# Patient Record
Sex: Female | Born: 1965 | Race: Black or African American | Hispanic: No | Marital: Single | Smoking: Never smoker
Health system: Southern US, Community
[De-identification: ages and names within clinical notes are randomized; demographics above are authoritative.]

## PROBLEM LIST (undated history)

## (undated) ENCOUNTER — Emergency Department (HOSPITAL_COMMUNITY): Admission: EM | Payer: Self-pay | Source: Home / Self Care

## (undated) DIAGNOSIS — N920 Excessive and frequent menstruation with regular cycle: Secondary | ICD-10-CM

## (undated) DIAGNOSIS — E039 Hypothyroidism, unspecified: Secondary | ICD-10-CM

## (undated) DIAGNOSIS — D649 Anemia, unspecified: Secondary | ICD-10-CM

## (undated) DIAGNOSIS — D219 Benign neoplasm of connective and other soft tissue, unspecified: Secondary | ICD-10-CM

## (undated) DIAGNOSIS — T148XXA Other injury of unspecified body region, initial encounter: Secondary | ICD-10-CM

## (undated) DIAGNOSIS — J189 Pneumonia, unspecified organism: Secondary | ICD-10-CM

## (undated) HISTORY — PX: WISDOM TOOTH EXTRACTION: SHX21

---

## 2007-07-09 ENCOUNTER — Inpatient Hospital Stay (HOSPITAL_COMMUNITY): Admission: EM | Admit: 2007-07-09 | Discharge: 2007-07-13 | Payer: Self-pay | Admitting: Emergency Medicine

## 2007-07-09 ENCOUNTER — Ambulatory Visit: Payer: Self-pay | Admitting: Cardiology

## 2007-07-10 ENCOUNTER — Encounter (INDEPENDENT_AMBULATORY_CARE_PROVIDER_SITE_OTHER): Payer: Self-pay | Admitting: Internal Medicine

## 2010-06-15 NOTE — H&P (Signed)
NAME:  Valerie Edwards, Valerie Edwards                ACCOUNT NO.:  [id number]   MEDICAL RECORD NO.:  [id number]          PATIENT TYPE:  INP   LOCATION:  0106                         FACILITY:  [facility]   PHYSICIAN:  Marcellus Scott, MD     DATE OF BIRTH:  12/07/1965   DATE OF ADMISSION:  07/09/2007  DATE OF DISCHARGE:                              HISTORY & PHYSICAL   PRIMARY MEDICAL DOCTOR:  Dr. Steva Ready in [place], [place].   CHIEF COMPLAINTS:  Near passing out, lower abdominal pain and diarrhea.   HISTORY OF PRESENTING ILLNESS:  Valerie Edwards is a 45 year old, pleasant,  African-American female patient with history of chronic anemia (unclear  baseline hemoglobin).  She was in her usual state of health until  approximately 9 this morning when she woke up from her bed and while  sitting at the edge of the bed experienced a fairly sudden onset of  lower abdominal sharp 6/10 pain which was nonradiating with associated  nausea but no vomiting.  The patient proceeded to go to the toilet and  sit on the commode.  She had two episodes of diarrhea with soft brown  stools and no blood, melena.  However, while sitting there, she started  having dizziness, lightheadedness and feeling the place go dark.  She  applied some wet towels on the head.  She subsequently came off of the  toilet and felt that she was going to pass out and held onto something  and sat on the floor.  There was no complete loss of consciousness  according to her.  She denies any chest pain, palpitations.  She then  called EMS.  She was apparently minimally responsive when they arrived  and responded to ammonia inhalation and sternal rubs.  She received  normal saline 200 mL prior to arrival with partial improvement of her  symptoms.  Further evaluation in the emergency room revealed patient  with low blood pressure of 88/61 mmHg and markedly anemic with a  hemoglobin of 4.9.  Currently, the patient indicates that her abdominal  pain has  resolved for the last one hour off her medications.  She,  however, complains of soreness in her lower extremities.   PAST MEDICAL HISTORY:  Anemia since 1984.  No history of blood  transfusions.  No other past medical history.   PAST SURGICAL HISTORY:  None.   ALLERGIES:  No known drug allergies.   MEDICATIONS:  Infrequent intake of multivitamins.   FAMILY HISTORY:  The patient's sister is a diabetic.  The patient's  mother has history of asthma.  The patient's maternal grandfather died  at age 14 from heart disease.   SOCIAL HISTORY:  The patient is separated.  She is unemployed currently.  She used to work with [employer].  She has two girl children, 20  and 16 years.  There is no history of tobacco, alcohol or drug abuse.   The patient had menarche at age 40.  Her last menstrual period was 8  days ago.  The patient indicates that for the last three years has been  having intermittent  heavy menstrual bleeding.  However, the most recent  one was the last menstrual period when she had initial three out of five  days of heavy menstrual bleeding, and she had to change eight pads per  day, and she had multiple clots.  She also gives history of feeling weak  progressively since then and some dyspnea on exertion but denies chest  pain, lightheadedness or dizziness until today.  According to her  daughter who is at the bedside, the patient has been also looking paler  and has been having difficulty getting out of bed.  The patient denies  any history of bleeding from the rectum, melena.   REVIEW OF SYSTEMS:  Fourteen systems reviewed and apart from history of  presenting illness is noncontributory.   PHYSICAL EXAMINATION:  Ms. Valerie Edwards is a moderately built and nourished  female patient who is extremely pale in no respiratory distress but  intermittent painful distress in moving lower extremities.  VITAL SIGNS:  98.2 degrees Fahrenheit, blood pressure 100/60, pulse of  86 per  minute regular, respiratory 20 per minute, saturating at 100% on  room air.  HEENT:  Is nontraumatic, normocephalic.  Pupils equally reacting to  light and accommodation.  Oral cavity with enlarged bilateral tonsils  but no erythema.  Mucosa slightly dry but pale and anicteric.  NECK:  Supple.  No JVD or carotid bruit or distended neck veins.  LYMPHATICS:  No lymphadenopathy.  BREAST EXAM:  Deferred.  RESPIRATORY SYSTEM:  Clear to auscultation bilaterally.  CARDIOVASCULAR SYSTEM:  First and second heart sounds are heard fairly  clearly.  No third or fourth heart sounds or murmurs.  ABDOMEN:  Nondistended, nontender.  Soft.  No organomegaly or mass  appreciated.  Bowel sounds are normally heard.  Hernial orifices are  appreciated and normal.  CENTRAL NERVOUS SYSTEM:  The patient is awake, alert, oriented x3.  No  cranial nerve deficits.  EXTREMITIES:  With no cyanosis, clubbing or edema.  Peripheral pulses  are symmetrically felt.  The patient has pain on moving her lower  extremities, but there is no swelling, erythema, rash or tenderness.  SKIN:  Is without any rashes.  MUSCULOSKELETAL SYSTEM:  Unremarkable.   LABORATORY DATA:  CBCs with hemoglobin 4.5, hematocrit 15, MCV 70.2,  white blood cell 5.3, platelets 347.  Electrolytes on ISTAT with sodium  of 141, potassium 3.6, chloride 108, bicarb 22, glucose 86, BUN 6,  creatinine 1.1.  Urinanalysis is not suggestive of urinary tract  infection.  Fecal occult blood testing negative.  Urine pregnancy test  negative.  CT of the abdomen without contrast - impression:  Pericardial  effusion which is moderate to large and minimal pleural fluid noted  bilaterally.  No acute intra-abdominal abnormality.  CT of the pelvis:  Small amount of free fluid in the pelvis with no acute abnormality.  EKG  with normal sinus rhythm at 66 per minute, low voltage.  Normal axis.  Q-  waves in the leads V1 to V3.   ASSESSMENT AND PLAN:  1. Near-syncope  secondary to symptomatic severe anemia, hypotension,      vasovagal from abdominal pain.  2. Severe microcytic symptomatic anemia.  This is probably secondary      to menorrhagia.  Will request anemia panel.  Will transfuse 4 units      of packed red blood cells and monitor post transition hemoglobin.      Will also obtain a peripheral smear.  3. Hypotension ? secondary to dehydration from  diarrhea.  Will hydrate      IV and monitor.  This hypotension seems to be improving.  4. Abdominal pain, diarrhea, lower extremity pain, ? acute      gastroenteritis versus food poisoning.  CT abdomen is negative for      acute abdominal pelvic process.  For pain medications and      monitoring. IV hydration.  5. Menorrhagia.  The patient will need a gynecology consult at some      point to address her menorrhalgia.  6. There is moderate to large pericardial effusion on CT, low voltage      QRS.  However, there is no clinical suggestion of a tamponade.      Cardiology has been consulted.   The patient will be admitted to ICU for close monitoring.      Marcellus Scott, MD  Electronically Signed     AH/MEDQ  D:  07/09/2007  T:  07/09/2007  Job:  [id number]   cc:   Sharyne Peach  Fax: [phone]

## 2010-06-15 NOTE — Discharge Summary (Signed)
NAME:  Valerie Edwards, Valerie Edwards                ACCOUNT NO.:  [id number]   MEDICAL RECORD NO.:  [id number]          PATIENT TYPE:  INP   LOCATION:  1404                         FACILITY:  [facility]   PHYSICIAN:  Hind I Elsaid, MD      DATE OF BIRTH:  23-Nov-1965   DATE OF ADMISSION:  07/09/2007  DATE OF DISCHARGE:  07/13/2007                               DISCHARGE SUMMARY   DISCHARGE DIAGNOSES:  1. Severe iron deficiency anemia.  2. Menorrhagia.  3. Diarrhea with dehydration, resolved.  4. Small pericardial effusion felt to be secondary to hypothyroidism.  5. Bradycardia, asymptomatic, secondary to hypothyroidism.  6. Small left pleural effusion felt to be secondary to uncontrolled      hypothyroidism.  7. Uncontrolled hypothyroidism secondary to noncompliance.  8. 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  9. Hyperlipidemia.   DISCHARGE MEDICATIONS:  1. Fioricet 325 mg p.o. t.i.d.  2. Folic acid 1 mg p.o. daily.  3. Synthroid 150 mcg p.o. q.a.m.  4. Zocor 20 mg p.o. nightly.   CONSULTATION:  1. Cardiology consulted for evaluation of the pericardial effusion      done by Dr. Olga Millers.  2. Gynecology consulted, done by Dr. Huel Cote.  3. Gastroenterology consulted for evaluation of microcytic anemia.   PROCEDURES:  1. CT of abdomen and pelvis:  Pericardial effusion.  No acute intra-      abdominal abnormality.  Small amount of free fluid in the pelvis,      no acute abnormality.  2. MRI of the thoracic spine:  Normal thoracic spine and spinal cord,      aside from mild to moderate posterior epidural lipomatosis in the      upper thoracic region which effaces the thecal sac at T5-T6 without      mass effect on the spinal cord that is possible course of      myelopathic symptoms. Mild C6-C7 disk degeneration, small layering      pleural effusion with prominent atelectasis.  3. Chest x-ray shows  small left pleural effusion, left lower      atelectasis versus airspace disease.   HISTORY OF PRESENT ILLNESS:  Valerie Edwards is a 45 year old pleasant  African American female.  She was in her usual state of health until  approximately June 8, when she woke up from her bed and started to have  sudden onset of lower abdominal sharp pain associated with nausea.  While sitting there, the patient started having dizzy spells with  lightheadedness and feeling the place go dark.  The patient was found to  have marked low blood pressure around 88/61 with hemoglobin of 4.9.  The  patient was admitted to the hospital for evaluation.   HOSPITAL CONSULTS:  PROBLEM #1 - NEAR SYNCOPE FELT TO BE SECONDARY TO  SEVERE ANEMIA:  The patient received 4 units of blood transfusion and  anemia panel did show evidence of severe iron deficiency anemia with  routine of 3.  Smear showed microcytic hypochromic anemia.  Accordingly,  the patient was placed on ferrous sulfate with folic acid supplement.  Gynecology was consulted, where they recommended outpatient workup and  the patient received information for the office and appointment will be  held within 1-2 weeks with Dr. Huel Cote.  The patient had a  stool guaiac negative and Helicobacter pylori negative and no evidence  of GI bleeding was noticed.   PROBLEM #2 - DIARRHEA:  Completely resolved.   PROBLEM #3 - PERICARDIAL EFFUSION:  The patient had a CT of abdomen  which showed evidence of large to moderate pericardial effusion.  Accordingly, the patient was placed on the intensive care unit for  further monitoring and Cardiology consulted, where they recommended 2-D  echo.  The 2-D echo showed a small amount of pericardial effusion.  No  further workup was done.  Cause of pericardial effusion was felt to be  secondary to uncontrolled hypothyroidism.  The patient needs to follow  up with Dr. Olga Millers within 1-2 weeks to repeat another 2-D echo   for evaluation of her pericardial effusion.   PROBLEM #4 - UNCONTROLLED HYPOTHYROIDISM:  The patient was found to have  a TSH of 220.419 with low free T4 and T3.  Accordingly, the patient was  started on Synthroid IV and the patient was advised to comply with her  medications.  The patient was also advised to follow her thyroid  function within 4-6 weeks with her primary care physician for further  adjustment of her Synthroid dose.   PROBLEM #5 - SMALL PLEURAL EFFUSION: This was felt possibly secondary to  uncontrolled hypothyroidism.   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  The patient at this time is asymptomatic.  We advised the  patient to follow up with her primary care physician regarding her CT  scan report for further management if she develops symptoms.   PROBLEM #7 - SINUS BRADYCARDIA: This was felt secondary to severe  hypothyroidism   PROBLEM #8 - HYPERLIPIDEMIA:  The patient was placed on Zocor and  advised to follow her LFTs with her primary care physician.   DISPOSITION AND FOLLOWUP:  1. We felt that the patient was medically stable to be discharged home      and to follow up with Dr. Huel Cote from [facility]      Gynecology for further workup of her menorrhagia.  2. Follow with your primary care physician your thyroid function tests      and your back x-ray.  3. Follow up with Dr. Olga Millers, a 2-D echo for reassessment of      the pericardial effusion.      Hind Bosie Helper, MD  Electronically Signed     HIE/MEDQ  D:  07/13/2007  T:  07/13/2007  Job:  [id number]   cc:   Sharyne Peach  Fax: [phone]

## 2010-06-15 NOTE — Consult Note (Signed)
NAME:  Valerie Edwards, Valerie Edwards                ACCOUNT NO.:  [id number]   MEDICAL RECORD NO.:  [id number]          PATIENT TYPE:  INP   LOCATION:  1223                         FACILITY[facility]   PHYSICIAN:  Jordan Hawks. Elnoria Howard, MD    DATE OF BIRTH:  March 01, 1965   DATE OF CONSULTATION:  07/10/2007  DATE OF DISCHARGE:                                 CONSULTATION   REASON FOR CONSULTATION:  Anemia and abdominal pain.   REFERRING PHYSICIAN:  [facility] hospitalist, this is an unassigned  patient.   HISTORY OF PRESENT ILLNESS:  This is a 45 year old female with a past  medical history of chronic anemia who is admitted to the hospital with  complaints of weakness and lower abdominal pain.  Apparently when she  woke up in the morning, she used the restroom.  At that time, she  experienced some lower abdominal pain which was crampy in nature and  subsequently she went to the bathroom.  At that time she did have some  episodes of diarrhea, but there are no reports of hematochezia or  melena.  While she was using the commode,  lightheadedness and dizziness  ensued.  She subsequently presented to the emergency room for further  evaluation and treatment.  While in the emergency room, she was noted to  have a low systolic blood pressure in the 80s range and she was  aggressively treated with IV hydration.  Because of her complaints of  abdominal pain and these near syncopal type of episodes, a CT scan was  performed of the abdomen.  There is finding of a moderate-sized  pericardial effusion, but no acute intra-abdominal findings.  Upon  interview at this time, the patient denies having any further abdominal  pain.  She only states that her pain was during the time that she used  the restroom on the day of admission.  She again denied having any  hematochezia or melena.  No recent use of NSAIDs.  No prior history of  GI issues in the past.  She does report having menorrhagia of late.  She  had her last period  approximately 1 week ago and subsequently after  having the period she felt markedly weaker.  Her prior periods per her  report have not been heavy.  She does not have a history of menorrhagia  the past.  Per her report, she does have fibroids.   PAST MEDICAL/SURGICAL HISTORY:  As stated above.   FAMILY HISTORY:  Noncontributory.   ALLERGIES:  NO KNOWN DRUG ALLERGIES.   SOCIAL HISTORY:  No alcohol, tobacco or illicit drug use.   REVIEW OF SYSTEMS:  Negative as stated above in the history of present  illness, otherwise negative.   ALLERGIES:  NO KNOWN DRUG ALLERGIES.   MEDICATIONS:  1. Iron 325 mg 1 p.o. t.i.d.  2. Protonix 40 mg IV daily.  3. Tylenol 650 mg 1 p.o. q.4 h. p.r.n.  4. Dilaudid 0.5 mg 1 mg IV q.4 h. p.r.n.  5. Zofran 4 mg IV q.8 h. p.r.n.   PHYSICAL EXAMINATION:  VITAL SIGNS:  Stable.  GENERAL:  The patient is in no acute distress, alert and oriented.  HEENT:  Normocephalic, atraumatic.  Extraocular muscles intact.  NECK:  Supple.  No lymphadenopathy.  LUNGS:  Clear to auscultation bilaterally.  CARDIOVASCULAR:  Regular rate and rhythm.  ABDOMEN:  Obese, soft, nontender, nondistended.  Positive bowel sounds.  EXTREMITIES:  No clubbing, cyanosis or edema.   LABORATORY DATA:  White blood cell count is 5.3, hemoglobin 10.1 which  is an increase from 5.6 after 4 units of packed red blood cells, MCV  77.9, platelets at 260, PT is 14.8, INR 1.1, sodium 139, potassium 3.9,  chloride 109, CO2 21, glucose 74, BUN 3, creatinine 0.8, calcium 7.8,  iron is 11, TIBC is 401, percent saturation is 3, ferritin is 3,  hemoccult negative.   IMPRESSION:  1. Severe iron-deficiency anemia.  2. Menorrhagia.  3. Diarrhea/abdominal pain which is resolved.  4. Pericardial effusion.   After evaluation of the patient, she denies having any GI symptoms at  this time, although these had been the precipitating symptoms for her  admission.  There is no report of any hematochezia,  melena or current  abdominal pain.  Nursing does not report any further diarrhea and no  abdominal complaints.  She does give a history of menorrhagia.  I am  suspecting that this is the source of her severe iron-deficiency anemia.  Typically in  females who are currently menstruating, GI consultations  are typically deferred until the vaginal bleeding issues can be  resolved.  I do not get a sense that she has any overt GI source of  bleeding at this time, although if her clinical situation changes, an  EGD and colonoscopy can be performed.   PLAN:  I would recommend a gynecology consult first.  Certainly if they  recommend that GI evaluation should be pursued, this can be performed  without hesitation.  Otherwise, no further GI workup at this time.      Jordan Hawks Elnoria Howard, MD  Electronically Signed     PDH/MEDQ  D:  07/10/2007  T:  07/10/2007  Job:  [id number]

## 2010-06-15 NOTE — Consult Note (Signed)
NAME:  Babin, Doaa                ACCOUNT NO.:  [id number]   MEDICAL RECORD NO.:  [id number]          PATIENT TYPE:  INP   LOCATION:  0106                         FACILITY:  [facility]   PHYSICIAN:  Madolyn Frieze. Jens Som, MD, FACCDATE OF BIRTH:  24-Dec-1965   DATE OF CONSULTATION:  07/09/2007  DATE OF DISCHARGE:                                 CONSULTATION   Ms. Brutus is a 42-year female with a past medical history of  hypothyroidism who I am asked to evaluate for pericardial effusion.  Note the patient has no prior cardiac history.  Over the past few weeks  she has noticed some dyspnea on exertion.  However, she denies any  orthopnea, PND, pedal edema, palpitations, presyncope, syncope,  exertional chest pain.  She has had significant fatigue.  This morning,  she felt weak and had some lower abdominal pain.  The pain was described  as sharp.  It did not radiate.  There was no associated melena,  hematochezia, hematemesis or dysuria.  There were no acholic stools.  She is having bowel movements.  She also had some nausea.  When she  stood up she felt like I was going to pass out and sat down on the  commode.  Because of the above, she was brought to the emergency room.  In the emergency room a CT of the abdomen showed a moderate to large  pericardial effusion and cardiology was asked to further evaluate.  Note  the patient has had some cold intolerance.  She also has had fatigue but  there is no recent weight loss.  She is on no medications at home.  She  has no known drug allergies.   PAST MEDICAL HISTORY:  Significant for hypothyroidism by her report.  However, she states she is not taking any replacement therapy.  She also  has a history of fibroids.  There is no diabetes mellitus, hypertension  or hyperlipidemia.   FAMILY HISTORY:  Negative for coronary disease.   SOCIAL HISTORY:  She denies any tobacco, alcohol or drug use.   REVIEW OF SYSTEMS:  She denies any headaches or fevers or  chills.  There  is no productive cough or hemoptysis.  There is no dysphagia,  odynophagia, melena or hematochezia.  There is no dysuria or hematuria.  There is no rashes or seizure activity.  There is no orthopnea, PND or  pedal edema.  She has had fatigue as described in HPI.  Remaining  systems are negative.   PHYSICAL EXAMINATION TODAY:  Shows that her pulse is 66.  Her blood  pressure on arrival was 88/61.  Temperature is 96.6.  She is well-  developed and well-nourished in no acute distress at present.  Skin is  warm and dry although pale.  She does not appear to be depressed and  there is no peripheral clubbing.  BACK:  Normal.  HEENT:  Normal with normal eyelids.  Note there are pale conjunctivae  consistent with an anemia.  NECK:  Supple with a normal upstroke bilaterally.  There are no bruits  noted.  There is no  jugular distention and there is no thyromegaly  noted.  CHEST:  Clear to auscultation with normal expansion.  CARDIOVASCULAR:  Reveals a regular rate and rhythm.  Normal S1 and S2.  There are no murmurs, rubs or gallops noted.  ABDOMINAL EXAM:  Not tender or distended.  Positive bowel sounds.  No  hepatosplenomegaly and no mass appreciated.  There is no abdominal  bruit.  She has 2+ femoral pulses bilaterally and no bruits.  EXTREMITIES:  Show no edema and I can palpate no cords.  She has 2+  dorsalis pedis pulses bilaterally.  NEUROLOGIC EXAM:  Grossly intact.   A CT of her abdomen showed that the upper cuts through the chest showed  a moderate to large pericardial effusion by report.  There was no  ascites noted.  There was a small amount of free fluid in the pelvis but  otherwise is unremarkable.  Her pregnancy test was negative.  White  blood cell count of 5.3 with a hemoglobin of 4.5 and hematocrit of 15.  Her platelet count is 347.  Sodium is 141 with a potassium of 3.6.  BUN  and creatinine are 6 and 1.1 respectively.  Her electrocardiogram shows  a sinus  rhythm at a rate of 66.  There is poor R-wave progression and  prior septal infarct cannot be excluded.   DIAGNOSES:  1. Pericardial effusion - the etiology of this is unclear.  I did      review the CT scan and it appears to be moderate by my review.  We      will check an echocardiogram tomorrow morning to better quantitate.      Note, there is no jugular venous distention on examination and I do      not think it is hemodynamically significant at this time.  I could      not appreciate a pulsus paradoxus.  I agree with checking a TSH.  I      wonder whether her history of hypothyroidism and noncompliance with      taking a replacement therapy may be contributing to her pericardial      effusion.  2. Severe anemia - the etiology of this is unclear but certainly may      be related to her menstrual cycles and also note she does have      fibroids.  I agree with transfusion and I think this is most likely      the cause of her presyncope.  I will leave the anemia evaluation      per primary service.  3. Hypothyroidism - as above we will check a TSH and treat as      indicated.  4. Presyncope - as per above we feel this is most related to her      anemia.   We will be happy to follow while she is in the hospital.      Madolyn Frieze. Jens Som, MD, [facility]  Electronically Signed     BSC/MEDQ  D:  07/09/2007  T:  07/09/2007  Job:  [id number]

## 2010-10-28 LAB — COMPREHENSIVE METABOLIC PANEL
ALT: 13
ALT: 18
AST: 33
AST: 41 — ABNORMAL HIGH
Albumin: 2.7 — ABNORMAL LOW
Albumin: 3.5
Alkaline Phosphatase: 23 — ABNORMAL LOW
Calcium: 7.8 — ABNORMAL LOW
Calcium: 8.2 — ABNORMAL LOW
Creatinine, Ser: 0.82
GFR calc Af Amer: >60
GFR calc Af Amer: >60
Potassium: 4.3
Sodium: 137
Sodium: 140
Total Protein: 7.1

## 2010-10-28 LAB — CBC
HCT: 15 — ABNORMAL LOW
HCT: 31.3 — ABNORMAL LOW
HCT: 32.6 — ABNORMAL LOW
Hemoglobin: 10.3 — ABNORMAL LOW
Hemoglobin: 10.7 — ABNORMAL LOW
MCHC: 32.5
MCHC: 32.9
MCHC: 33
MCV: 77.9 — ABNORMAL LOW
MCV: 78.5
MCV: 78.6
Platelets: 236
Platelets: 242
Platelets: 347
RBC: 3.94
RBC: 3.95
RBC: 3.99
RBC: 4.15
RDW: 19.7 — ABNORMAL HIGH
RDW: 21.6 — ABNORMAL HIGH
WBC: 5
WBC: 5.2
WBC: 5.3

## 2010-10-28 LAB — TSH: TSH: 220.419 — ABNORMAL HIGH

## 2010-10-28 LAB — URINALYSIS, ROUTINE W REFLEX MICROSCOPIC
Hgb urine dipstick: NEGATIVE
Leukocytes, UA: NEGATIVE
Protein, ur: NEGATIVE
Specific Gravity, Urine: 1.012
Urobilinogen, UA: 0.2

## 2010-10-28 LAB — IRON AND TIBC
Iron: 11 — ABNORMAL LOW
TIBC: 401

## 2010-10-28 LAB — T4, FREE: Free T4: 0.27 — ABNORMAL LOW

## 2010-10-28 LAB — ABO/RH: ABO/RH(D): A POS

## 2010-10-28 LAB — LIPID PANEL
HDL: 46
Triglycerides: 98
VLDL: 20

## 2010-10-28 LAB — POCT I-STAT, CHEM 8
BUN: 6
Calcium, Ion: 1.14
Creatinine, Ser: 1.1
HCT: 17 — ABNORMAL LOW
TCO2: 22

## 2010-10-28 LAB — CROSSMATCH
ABO/RH(D): A POS
Antibody Screen: NEGATIVE

## 2010-10-28 LAB — BASIC METABOLIC PANEL
CO2: 21
Calcium: 7.8 — ABNORMAL LOW
Chloride: 109
Chloride: 109
Creatinine, Ser: 0.8
GFR calc Af Amer: >60
GFR calc non Af Amer: >60
Glucose, Bld: 74
Potassium: 3.5
Sodium: 134 — ABNORMAL LOW

## 2010-10-28 LAB — HEMOGLOBIN AND HEMATOCRIT, BLOOD: Hemoglobin: 10 — ABNORMAL LOW

## 2010-10-28 LAB — URINE MICROSCOPIC-ADD ON

## 2010-10-28 LAB — PATHOLOGIST SMEAR REVIEW

## 2010-10-28 LAB — DIFFERENTIAL
Blasts: 0
Eosinophils Relative: 1
Metamyelocytes Relative: 0
Monocytes Relative: 3
Neutrophils Relative %: 82 — ABNORMAL HIGH
nRBC: 0

## 2010-10-28 LAB — OCCULT BLOOD X 1 CARD TO LAB, STOOL: Fecal Occult Bld: NEGATIVE

## 2010-10-28 LAB — H. PYLORI ANTIBODY, IGG: H Pylori IgG: 0.4

## 2010-10-28 LAB — FERRITIN: Ferritin: 3 — ABNORMAL LOW (ref 10–291)

## 2010-10-28 LAB — T3, FREE: T3, Free: <0.3 — ABNORMAL LOW (ref 2.3–4.2)

## 2010-10-28 LAB — RETICULOCYTES: Retic Count, Absolute: 27.7

## 2010-10-28 LAB — SAMPLE TO BLOOD BANK

## 2010-10-28 LAB — PROTIME-INR: INR: 1.1

## 2010-10-28 LAB — VITAMIN B12: Vitamin B-12: 1225 — ABNORMAL HIGH (ref 211–911)

## 2010-10-28 LAB — PREPARE RBC (CROSSMATCH)

## 2010-10-28 LAB — SAVE SMEAR

## 2011-04-06 ENCOUNTER — Other Ambulatory Visit: Payer: Self-pay | Admitting: Internal Medicine

## 2011-04-06 DIAGNOSIS — Z1231 Encounter for screening mammogram for malignant neoplasm of breast: Secondary | ICD-10-CM

## 2011-04-21 ENCOUNTER — Ambulatory Visit: Payer: Self-pay

## 2011-05-10 ENCOUNTER — Ambulatory Visit
Admission: RE | Admit: 2011-05-10 | Discharge: 2011-05-10 | Disposition: A | Discharge status: Home / Self Care | Payer: 59 | Source: Ambulatory Visit | Attending: Internal Medicine | Admitting: Internal Medicine

## 2011-05-10 DIAGNOSIS — Z1231 Encounter for screening mammogram for malignant neoplasm of breast: Secondary | ICD-10-CM

## 2013-01-08 ENCOUNTER — Other Ambulatory Visit: Payer: Self-pay | Admitting: Obstetrics & Gynecology

## 2013-01-08 DIAGNOSIS — D259 Leiomyoma of uterus, unspecified: Secondary | ICD-10-CM

## 2013-01-14 ENCOUNTER — Telehealth: Payer: Self-pay | Admitting: Radiology

## 2013-01-14 NOTE — Telephone Encounter (Signed)
Reminder call for app't on 01/15/2013 at 3:30 pm/arrive at 3:15 pm.  Left message on patient's M#.  Amado Nash, RN 01/14/2013 3:19 PM

## 2013-01-15 ENCOUNTER — Ambulatory Visit
Admission: RE | Admit: 2013-01-15 | Discharge: 2013-01-15 | Disposition: A | Discharge status: Home / Self Care | Payer: 59 | Source: Ambulatory Visit | Attending: Obstetrics & Gynecology | Admitting: Obstetrics & Gynecology

## 2013-01-15 ENCOUNTER — Other Ambulatory Visit: Payer: Self-pay | Admitting: Obstetrics & Gynecology

## 2013-01-15 DIAGNOSIS — D259 Leiomyoma of uterus, unspecified: Secondary | ICD-10-CM

## 2013-01-26 ENCOUNTER — Ambulatory Visit
Admission: RE | Admit: 2013-01-26 | Discharge: 2013-01-26 | Disposition: A | Discharge status: Home / Self Care | Payer: 59 | Source: Ambulatory Visit | Attending: Obstetrics & Gynecology | Admitting: Obstetrics & Gynecology

## 2013-01-26 DIAGNOSIS — D259 Leiomyoma of uterus, unspecified: Secondary | ICD-10-CM

## 2013-01-26 MED ORDER — GADOBENATE DIMEGLUMINE 529 MG/ML IV SOLN
12.0000 mL | Freq: Once | INTRAVENOUS | Status: AC | PRN
  Administered 2013-01-26: 12 mL via INTRAVENOUS

## 2013-02-04 ENCOUNTER — Telehealth: Payer: Self-pay | Admitting: Emergency Medicine

## 2013-02-04 NOTE — Telephone Encounter (Signed)
PT CALLED TO CK ON MRI RESULTS- TOLD HER THAT DR SHICK REVIEWED IT THIS MORNING AND SHE IS GOOD TO GO FOR [place]. WILL CK WITH INS. FOR [facility] AND [facility] WILL CONTACT HER WITH APPT.

## 2013-02-08 ENCOUNTER — Other Ambulatory Visit: Payer: Self-pay | Admitting: Diagnostic Neuroimaging

## 2013-02-08 DIAGNOSIS — G35 Multiple sclerosis: Secondary | ICD-10-CM

## 2013-02-14 ENCOUNTER — Other Ambulatory Visit: Payer: Self-pay | Admitting: Radiology

## 2013-02-14 ENCOUNTER — Other Ambulatory Visit: Payer: Self-pay | Admitting: Interventional Radiology

## 2013-02-14 DIAGNOSIS — D219 Benign neoplasm of connective and other soft tissue, unspecified: Secondary | ICD-10-CM

## 2013-02-18 ENCOUNTER — Ambulatory Visit (HOSPITAL_COMMUNITY): Admission: RE | Admit: 2013-02-18 | Payer: 59 | Source: Ambulatory Visit

## 2013-02-20 ENCOUNTER — Other Ambulatory Visit: Payer: Self-pay | Admitting: Radiology

## 2013-02-20 ENCOUNTER — Encounter (HOSPITAL_COMMUNITY): Payer: Self-pay | Admitting: Pharmacy Technician

## 2013-02-21 ENCOUNTER — Other Ambulatory Visit (HOSPITAL_COMMUNITY): Payer: Self-pay | Admitting: Radiology

## 2013-02-26 ENCOUNTER — Observation Stay (HOSPITAL_COMMUNITY)
Admission: RE | Admit: 2013-02-26 | Discharge: 2013-02-27 | Disposition: A | Discharge status: Home / Self Care | Payer: 59 | Source: Ambulatory Visit | Attending: Interventional Radiology | Admitting: Interventional Radiology

## 2013-02-26 ENCOUNTER — Ambulatory Visit (HOSPITAL_COMMUNITY)
Admission: RE | Admit: 2013-02-26 | Discharge: 2013-02-26 | Disposition: A | Discharge status: Home / Self Care | Payer: 59 | Source: Ambulatory Visit | Attending: Interventional Radiology | Admitting: Interventional Radiology

## 2013-02-26 ENCOUNTER — Encounter (HOSPITAL_COMMUNITY): Payer: Self-pay

## 2013-02-26 ENCOUNTER — Other Ambulatory Visit: Payer: Self-pay | Admitting: Interventional Radiology

## 2013-02-26 VITALS — BP 106/57 | HR 64 | Temp 98.1°F | Resp 16 | Ht 64.0 in | Wt 149.0 lb

## 2013-02-26 DIAGNOSIS — Z79899 Other long term (current) drug therapy: Secondary | ICD-10-CM | POA: Insufficient documentation

## 2013-02-26 DIAGNOSIS — D219 Benign neoplasm of connective and other soft tissue, unspecified: Secondary | ICD-10-CM

## 2013-02-26 DIAGNOSIS — D649 Anemia, unspecified: Secondary | ICD-10-CM | POA: Insufficient documentation

## 2013-02-26 DIAGNOSIS — D259 Leiomyoma of uterus, unspecified: Principal | ICD-10-CM | POA: Insufficient documentation

## 2013-02-26 DIAGNOSIS — N920 Excessive and frequent menstruation with regular cycle: Secondary | ICD-10-CM

## 2013-02-26 HISTORY — DX: Anemia, unspecified: D64.9

## 2013-02-26 HISTORY — PX: UTERINE ARTERY EMBOLIZATION: SHX2629

## 2013-02-26 HISTORY — DX: Benign neoplasm of connective and other soft tissue, unspecified: D21.9

## 2013-02-26 HISTORY — DX: Excessive and frequent menstruation with regular cycle: N92.0

## 2013-02-26 LAB — CBC
HEMATOCRIT: 28 % — AB (ref 36.0–46.0)
Hemoglobin: 8.7 g/dL — ABNORMAL LOW (ref 12.0–15.0)
MCH: 24.7 pg — AB (ref 26.0–34.0)
MCHC: 31.1 g/dL (ref 30.0–36.0)
MCV: 79.5 fL (ref 78.0–100.0)
PLATELETS: 407 10*3/uL — AB (ref 150–400)
RBC: 3.52 MIL/uL — ABNORMAL LOW (ref 3.87–5.11)
RDW: 17.3 % — AB (ref 11.5–15.5)
WBC: 6.7 10*3/uL (ref 4.0–10.5)

## 2013-02-26 LAB — BASIC METABOLIC PANEL
BUN: 9 mg/dL (ref 6–23)
CALCIUM: 8.9 mg/dL (ref 8.4–10.5)
CHLORIDE: 105 meq/L (ref 96–112)
CO2: 23 meq/L (ref 19–32)
Creatinine, Ser: 0.58 mg/dL (ref 0.50–1.10)
GFR calc Af Amer: >90 mL/min (ref >90)
GFR calc non Af Amer: >90 mL/min (ref >90)
GLUCOSE: 97 mg/dL (ref 70–99)
Potassium: 3.6 mEq/L — ABNORMAL LOW (ref 3.7–5.3)
Sodium: 139 mEq/L (ref 137–147)

## 2013-02-26 LAB — APTT: aPTT: 26 seconds (ref 24–37)

## 2013-02-26 LAB — PROTIME-INR
INR: 0.96 (ref 0.00–1.49)
Prothrombin Time: 12.6 seconds (ref 11.6–15.2)

## 2013-02-26 LAB — HCG, SERUM, QUALITATIVE: Preg, Serum: NEGATIVE

## 2013-02-26 MED ORDER — PROMETHAZINE HCL 25 MG PO TABS: 25.0000 mg | ORAL_TABLET | Freq: Three times a day (TID) | ORAL | Status: DC | PRN

## 2013-02-26 MED ORDER — SODIUM CHLORIDE 0.9 % IV SOLN: 250.0000 mL | INTRAVENOUS | Status: DC | PRN

## 2013-02-26 MED ORDER — MIDAZOLAM HCL 2 MG/2ML IJ SOLN
INTRAMUSCULAR | Status: AC
  Filled 2013-02-26: qty 4

## 2013-02-26 MED ORDER — ONDANSETRON HCL 4 MG/2ML IJ SOLN: 4.0000 mg | Freq: Four times a day (QID) | INTRAMUSCULAR | Status: DC | PRN

## 2013-02-26 MED ORDER — KETOROLAC TROMETHAMINE 30 MG/ML IJ SOLN
30.0000 mg | INTRAMUSCULAR | Status: AC
  Administered 2013-02-26: 30 mg via INTRAVENOUS
  Filled 2013-02-26: qty 1

## 2013-02-26 MED ORDER — DEXAMETHASONE SODIUM PHOSPHATE 10 MG/ML IJ SOLN
10.0000 mg | Freq: Once | INTRAMUSCULAR | Status: AC
  Administered 2013-02-26: 10 mg via INTRAVENOUS
  Filled 2013-02-26: qty 1

## 2013-02-26 MED ORDER — PROMETHAZINE HCL 25 MG RE SUPP
25.0000 mg | Freq: Three times a day (TID) | RECTAL | Status: DC | PRN
  Filled 2013-02-26: qty 1

## 2013-02-26 MED ORDER — SODIUM CHLORIDE 0.9 % IV SOLN
Freq: Once | INTRAVENOUS | Status: AC
  Administered 2013-02-26: 08:00:00 via INTRAVENOUS

## 2013-02-26 MED ORDER — MIDAZOLAM HCL 2 MG/2ML IJ SOLN
INTRAMUSCULAR | Status: AC | PRN
  Administered 2013-02-26: 2 mg via INTRAVENOUS

## 2013-02-26 MED ORDER — FENTANYL CITRATE 0.05 MG/ML IJ SOLN
INTRAMUSCULAR | Status: AC
  Filled 2013-02-26: qty 8

## 2013-02-26 MED ORDER — FERROUS SULFATE 325 (65 FE) MG PO TABS
325.0000 mg | ORAL_TABLET | Freq: Two times a day (BID) | ORAL | Status: DC
  Administered 2013-02-26 – 2013-02-27 (×2): 325 mg via ORAL
  Filled 2013-02-26 (×4): qty 1

## 2013-02-26 MED ORDER — DOCUSATE SODIUM 100 MG PO CAPS
100.0000 mg | ORAL_CAPSULE | Freq: Two times a day (BID) | ORAL | Status: DC
  Administered 2013-02-26: 100 mg via ORAL
  Filled 2013-02-26 (×3): qty 1

## 2013-02-26 MED ORDER — LEVOTHYROXINE SODIUM 175 MCG PO TABS
175.0000 ug | ORAL_TABLET | Freq: Every day | ORAL | Status: DC
  Administered 2013-02-27: 175 ug via ORAL
  Filled 2013-02-26 (×2): qty 1

## 2013-02-26 MED ORDER — IOHEXOL 300 MG/ML  SOLN
50.0000 mL | Freq: Once | INTRAMUSCULAR | Status: AC | PRN
  Administered 2013-02-26: 1 mL via INTRA_ARTERIAL

## 2013-02-26 MED ORDER — SODIUM CHLORIDE 0.9 % IJ SOLN: 3.0000 mL | Freq: Two times a day (BID) | INTRAMUSCULAR | Status: DC

## 2013-02-26 MED ORDER — SODIUM CHLORIDE 0.9 % IJ SOLN: 9.0000 mL | INTRAMUSCULAR | Status: DC | PRN

## 2013-02-26 MED ORDER — SODIUM CHLORIDE 0.9 % IJ SOLN: 3.0000 mL | INTRAMUSCULAR | Status: DC | PRN

## 2013-02-26 MED ORDER — DIPHENHYDRAMINE HCL 12.5 MG/5ML PO ELIX
12.5000 mg | ORAL_SOLUTION | Freq: Four times a day (QID) | ORAL | Status: DC | PRN
  Filled 2013-02-26: qty 5

## 2013-02-26 MED ORDER — HYDROMORPHONE 0.3 MG/ML IV SOLN
INTRAVENOUS | Status: DC
  Administered 2013-02-26: 12:00:00 via INTRAVENOUS
  Administered 2013-02-26: 1.2 mg via INTRAVENOUS
  Administered 2013-02-27: 0.3 mg via INTRAVENOUS
  Administered 2013-02-27: 0.9 mg via INTRAVENOUS

## 2013-02-26 MED ORDER — SODIUM CHLORIDE 0.9 % IV SOLN
INTRAVENOUS | Status: DC
Start: 2013-02-26 — End: 2013-02-27
  Administered 2013-02-26: 15:00:00 via INTRAVENOUS

## 2013-02-26 MED ORDER — FENTANYL CITRATE 0.05 MG/ML IJ SOLN
INTRAMUSCULAR | Status: AC | PRN
  Administered 2013-02-26: 100 ug via INTRAVENOUS
  Administered 2013-02-26 (×2): 50 ug via INTRAVENOUS

## 2013-02-26 MED ORDER — ONDANSETRON HCL 4 MG/2ML IJ SOLN
4.0000 mg | Freq: Four times a day (QID) | INTRAMUSCULAR | Status: DC | PRN
  Administered 2013-02-26: 4 mg via INTRAVENOUS
  Filled 2013-02-26: qty 2

## 2013-02-26 MED ORDER — NALOXONE HCL 0.4 MG/ML IJ SOLN: 0.4000 mg | INTRAMUSCULAR | Status: DC | PRN

## 2013-02-26 MED ORDER — CEFAZOLIN SODIUM-DEXTROSE 2-3 GM-% IV SOLR
2.0000 g | Freq: Once | INTRAVENOUS | Status: AC
  Administered 2013-02-26: 2 g via INTRAVENOUS
  Filled 2013-02-26: qty 50

## 2013-02-26 MED ORDER — HYDROMORPHONE 0.3 MG/ML IV SOLN
INTRAVENOUS | Status: AC
  Administered 2013-02-26: 1.2 mg via INTRAVENOUS
  Filled 2013-02-26: qty 25

## 2013-02-26 MED ORDER — DIPHENHYDRAMINE HCL 50 MG/ML IJ SOLN
12.5000 mg | Freq: Four times a day (QID) | INTRAMUSCULAR | Status: DC | PRN
  Administered 2013-02-26: 12.5 mg via INTRAVENOUS
  Filled 2013-02-26: qty 1

## 2013-02-26 MED ORDER — HYDROMORPHONE HCL PF 2 MG/ML IJ SOLN
INTRAMUSCULAR | Status: AC
  Filled 2013-02-26: qty 1

## 2013-02-26 MED ORDER — SODIUM CHLORIDE 0.9 % IV SOLN: INTRAVENOUS | Status: DC

## 2013-02-26 MED ORDER — KETOROLAC TROMETHAMINE 30 MG/ML IJ SOLN
30.0000 mg | Freq: Four times a day (QID) | INTRAMUSCULAR | Status: DC
  Administered 2013-02-26 – 2013-02-27 (×3): 30 mg via INTRAVENOUS
  Filled 2013-02-26 (×7): qty 1

## 2013-02-26 NOTE — H&P (Signed)
Chief Complaint: "I'm here for fibroid procedure"  HPI: Valerie Edwards is an 48 y.o. female with hx of menorrhagia and fibroids. She was seen by Dr. Annamaria Boots for evaluation of uterine artery embolization and is now scheduled for procedure. Please see Dr. Fritz Pickerel full consult under imaging section IR Rad Eval. Pt feels ok today, no recent fevers, chills, N/V, dysuria PMHx and meds reviewed.  Past Medical History:  Past Medical History  Diagnosis Date  . Anemia   . Fibroids   . Menorrhagia     Past Surgical History: History reviewed. No pertinent past surgical history.  Family History: History reviewed. No pertinent family history.  Social History:  reports that she has never smoked. She has never used smokeless tobacco. She reports that she does not drink alcohol or use illicit drugs.  Allergies: No Known Allergies  Medications: Current facility-administered medications:ceFAZolin (ANCEF) IVPB 2 g/50 mL premix, 2 g, Intravenous, Once, Koreen D Morgan, PA-C;  diphenhydrAMINE (BENADRYL) 12.5 MG/5ML elixir 12.5 mg, 12.5 mg, Oral, Q6H PRN, Azzie Roup, MD;  diphenhydrAMINE (BENADRYL) injection 12.5 mg, 12.5 mg, Intravenous, Q6H PRN, Azzie Roup, MD, 12.5 mg at 02/26/13 0907;  fentaNYL (SUBLIMAZE) 0.05 MG/ML injection, , , ,  HYDROmorphone (DILAUDID) 2 MG/ML injection, , , , ;  HYDROmorphone (DILAUDID) PCA injection 0.3 mg/mL, , Intravenous, Q4H, Azzie Roup, MD;  HYDROmorphone PCA 0.3 mg/mL (DILAUDID) 0.3 mg/mL infusion, , , , ;  iohexol (OMNIPAQUE) 300 MG/ML solution 50 mL, 50 mL, Intra-arterial, Once PRN, Medication Radiologist, MD;  midazolam (VERSED) 2 MG/2ML injection, , , , ;  naloxone (NARCAN) injection 0.4 mg, 0.4 mg, Intravenous, PRN, Azzie Roup, MD ondansetron [facility]) injection 4 mg, 4 mg, Intravenous, Q6H PRN, Azzie Roup, MD, 4 mg at 02/26/13 0911;  sodium chloride 0.9 % injection 9 mL, 9 mL, Intravenous, PRN, Azzie Roup, MD Current outpatient  prescriptions:ferrous sulfate 325 (65 FE) MG tablet, Take 325 mg by mouth 2 (two) times daily with a meal., Disp: , Rfl: ;  levothyroxine (SYNTHROID, LEVOTHROID) 175 MCG tablet, Take 175 mcg by mouth daily before breakfast., Disp: , Rfl:   Please HPI for pertinent positives, otherwise complete 10 system ROS negative.  Physical Exam: BP 111/73  Pulse 75  Temp(Src) 97 F (36.1 C) (Oral)  Resp 18  Ht '5\' 4"'  (1.626 m)  Wt 149 lb (67.586 kg)  BMI 25.56 kg/m2  SpO2 99%  LMP 02/10/2013 Body mass index is 25.56 kg/(m^2).   General Appearance:  Alert, cooperative, no distress, appears stated age  Head:  Normocephalic, without obvious abnormality, atraumatic  ENT: Unremarkable  Neck: Supple, symmetrical, trachea midline  Lungs:   Clear to auscultation bilaterally, no w/r/r, respirations unlabored without use of accessory muscles.  Chest Wall:  No tenderness or deformity  Heart:  Regular rate and rhythm, S1, S2 normal, no murmur, rub or gallop.  Abdomen:   Soft, non-tender, non distended.  Extremities: Extremities normal, atraumatic, no cyanosis or edema  Pulses: 2+ and symmetric femoral  Neurologic: Normal affect, no gross deficits.   Results for orders placed during the hospital encounter of 02/26/13 (from the past 48 hour(s))  APTT     Status: None   Collection Time    02/26/13  7:55 AM      Result Value Range   aPTT 26  24 - 37 seconds  BASIC METABOLIC PANEL     Status: Abnormal   Collection Time    02/26/13  7:55 AM  Result Value Range   Sodium 139  137 - 147 mEq/L   Potassium 3.6 (*) 3.7 - 5.3 mEq/L   Chloride 105  96 - 112 mEq/L   CO2 23  19 - 32 mEq/L   Glucose, Bld 97  70 - 99 mg/dL   BUN 9  6 - 23 mg/dL   Creatinine, Ser 0.58  0.50 - 1.10 mg/dL   Calcium 8.9  8.4 - 10.5 mg/dL   GFR calc non Af Amer >90  >90 mL/min   GFR calc Af Amer >90  >90 mL/min   Comment: (NOTE)     The eGFR has been calculated using the CKD EPI equation.     This calculation has not been  validated in all clinical situations.     eGFR's persistently <90 mL/min signify possible Chronic Kidney     Disease.  CBC     Status: Abnormal   Collection Time    02/26/13  7:55 AM      Result Value Range   WBC 6.7  4.0 - 10.5 K/uL   RBC 3.52 (*) 3.87 - 5.11 MIL/uL   Hemoglobin 8.7 (*) 12.0 - 15.0 g/dL   HCT 28.0 (*) 36.0 - 46.0 %   MCV 79.5  78.0 - 100.0 fL   MCH 24.7 (*) 26.0 - 34.0 pg   MCHC 31.1  30.0 - 36.0 g/dL   RDW 17.3 (*) 11.5 - 15.5 %   Platelets 407 (*) 150 - 400 K/uL  PROTIME-INR     Status: None   Collection Time    02/26/13  7:55 AM      Result Value Range   Prothrombin Time 12.6  11.6 - 15.2 seconds   INR 0.96  0.00 - 1.49   No results found.  Assessment/Plan Symptomatic uterine fibroids For Uterine artery embolization Discussed procedure, risks, complications, use of sedation and plan for admission overnight for observation Labs reviewed, ok Consent signed in chart  Ascencion Dike PA-C 02/26/2013, 9:16 AM

## 2013-02-26 NOTE — H&P (Signed)
Agree.  Patient seen.  For fibroid embolization to treat symptomatic uterine fibroids today.  Overnight observation after procedure.  Patient agreeable.

## 2013-02-26 NOTE — Progress Notes (Signed)
Subjective: Mild pelvic cramping.  Using PCA.  No nausea or vomiting.  Objective: Vital signs in last 24 hours: Temp:  [97 F (36.1 C)-98 F (36.7 C)] 97.8 F (36.6 C) (01/27 1610) Pulse Rate:  [58-75] 63 (01/27 1610) Resp:  [8-18] 14 (01/27 1610) BP: (93-132)/(55-78) 112/66 mmHg (01/27 1610) SpO2:  [94 %-100 %] 100 % (01/27 1610) Weight:  [149 lb (67.586 kg)] 149 lb (67.586 kg) (01/27 0741) Last BM Date: 02/25/13  Intake/Output from previous day:   Intake/Output this shift: Total I/O In: 85 [I.V.:85] Out: -   Abdomen:  Soft, nontender.  Right groin:  Nontender.  Lab Results:   Recent Labs  02/26/13 0755  WBC 6.7  HGB 8.7*  HCT 28.0*  PLT 407*   BMET  Recent Labs  02/26/13 0755  NA 139  K 3.6*  CL 105  CO2 23  GLUCOSE 97  BUN 9  CREATININE 0.58  CALCIUM 8.9   PT/INR  Recent Labs  02/26/13 0755  LABPROT 12.6  INR 0.96   ABG No results found for this basename: PHART, PCO2, PO2, HCO3,  in the last 72 hours  Studies/Results: Ir Angiogram Pelvis Selective Or Supraselective  02/26/2013   CLINICAL DATA:  Symptomatic uterine fibroid disease with menorrhagia.  EXAM: 1. ULTRASOUND GUIDANCE FOR VASCULAR ACCESS OF THE RIGHT COMMON FEMORAL ARTERY 2. SELECTIVE LEFT INTERNAL ILIAC AND UTERINE ARTERIOGRAPHY 3. SELECTIVE RIGHT INTERNAL ILIAC AND UTERINE ARTERIOGRAPHY 4. TRANSCATHETER EMBOLIZATION OF BILATERAL UTERINE ARTERIES FOR ORGAN ISCHEMIA 5. FOLLOW-UP ARTERIOGRAPHY OF THE LEFT UTERINE ARTERY AFTER EMBOLIZATION 6. FOLLOW-UP ARTERIOGRAPHY OF THE RIGHT UTERINE ARTERY AFTER EMBOLIZATION  ANESTHESIA/SEDATION: 2.0 Mg IV Versed; 100 mcg IV Fentanyl.  Total Moderate Sedation Time: 90  Minutes.  Contrast Volume: 70 ml OMNIPAQUE 300  Additional Medications: 2 g IV Ancef, 10 mg IV Decadron and 30 mg IV Toradol.  FLUOROSCOPY TIME:  24 min and 48 seconds.  PROCEDURE: The procedure, risks, benefits, and alternatives were explained to the patient. Questions regarding the  procedure were encouraged and answered. The patient understands and consents to the procedure.  The right groin was prepped with Betadine in a sterile fashion, and a sterile drape was applied covering the operative field. A sterile gown and sterile gloves were used for the procedure. Local anesthesia was provided with 1% Lidocaine.  Ultrasound was used to confirm patency of the right common femoral artery. After a small skin incision, a 21 gauge needle was advanced into the right common femoral artery under direct ultrasound guidance. Ultrasound image documentation was performed. After establishing guide wire access, a 5-French sheath was placed.  A 5 Fr diagnostic catheter was advanced over a guidewire into the distal abdominal aorta. The catheter was then used to selectively catheterize the left common iliac artery followed by the left internal iliac artery. Arteriography was performed.  A 2.8 Fr coaxial microcatheter was then introduced through the diagnostic catheter and advanced into the left uterine artery over a guide wire utilizing roadmapping technique. Selective arteriography of the left uterine artery was performed through the microcatheter. Left uterine artery embolization was then performed with installation of microsphere particles. Follow-up arteriography was performed after embolization.  The microcatheter was removed. The diagnostic catheter was then retracted and used to selectively catheterize the right internal iliac artery. Arteriography was performed. The microcatheter was then reintroduced and advanced into the right uterine artery over a guidewire utilizing roadmapping technique. Selective arteriography of the right uterine artery was then performed through the microcatheter.  Right uterine  artery embolization was then performed with installation of microsphere particles. Follow-up arteriography was performed after embolization.  Right femoral arteriotomy hemostasis:  Cordis ExoSeal   COMPLICATIONS: None.  FINDINGS: Bilateral uterine arteriography shows multiple enlarged hypervascular trunks supplying uterine fibroids. Left uterine artery embolization was performed utilizing 2 vials of 500-700 micron sized and 2/3 vial of 700-900 micron sized Embosphere particles. Completion arteriography demonstrates adequate occlusion of branches supplying uterine fibroids.  Right uterine artery embolization was performed utilizing tube vials of 500 -700 micron sized and 1-1/3 vials of 700-900 micron sized Embosphere particles. Completion arteriography demonstrates adequate occlusion of branches supplying uterine fibroids.  Adequate hemostasis was achieved at the femoral arteriotomy site.  IMPRESSION: Successful bilateral uterine artery embolization to treat symptomatic uterine fibroid disease. Adequate occlusion of branch vessels supplying uterine fibroids was achieved with microsphere particle embolization. The patient was admitted for overnight observation for treatment of post embolization symptoms. Initial clinical follow-up will be performed in 2 weeks.   Electronically Signed   By: Aletta Edouard M.D.   On: 02/26/2013 17:00   Ir Angiogram Pelvis Selective Or Supraselective  02/26/2013   CLINICAL DATA:  Symptomatic uterine fibroid disease with menorrhagia.  EXAM: 1. ULTRASOUND GUIDANCE FOR VASCULAR ACCESS OF THE RIGHT COMMON FEMORAL ARTERY 2. SELECTIVE LEFT INTERNAL ILIAC AND UTERINE ARTERIOGRAPHY 3. SELECTIVE RIGHT INTERNAL ILIAC AND UTERINE ARTERIOGRAPHY 4. TRANSCATHETER EMBOLIZATION OF BILATERAL UTERINE ARTERIES FOR ORGAN ISCHEMIA 5. FOLLOW-UP ARTERIOGRAPHY OF THE LEFT UTERINE ARTERY AFTER EMBOLIZATION 6. FOLLOW-UP ARTERIOGRAPHY OF THE RIGHT UTERINE ARTERY AFTER EMBOLIZATION  ANESTHESIA/SEDATION: 2.0 Mg IV Versed; 100 mcg IV Fentanyl.  Total Moderate Sedation Time: 90  Minutes.  Contrast Volume: 70 ml OMNIPAQUE 300  Additional Medications: 2 g IV Ancef, 10 mg IV Decadron and 30 mg IV Toradol.   FLUOROSCOPY TIME:  24 min and 48 seconds.  PROCEDURE: The procedure, risks, benefits, and alternatives were explained to the patient. Questions regarding the procedure were encouraged and answered. The patient understands and consents to the procedure.  The right groin was prepped with Betadine in a sterile fashion, and a sterile drape was applied covering the operative field. A sterile gown and sterile gloves were used for the procedure. Local anesthesia was provided with 1% Lidocaine.  Ultrasound was used to confirm patency of the right common femoral artery. After a small skin incision, a 21 gauge needle was advanced into the right common femoral artery under direct ultrasound guidance. Ultrasound image documentation was performed. After establishing guide wire access, a 5-French sheath was placed.  A 5 Fr diagnostic catheter was advanced over a guidewire into the distal abdominal aorta. The catheter was then used to selectively catheterize the left common iliac artery followed by the left internal iliac artery. Arteriography was performed.  A 2.8 Fr coaxial microcatheter was then introduced through the diagnostic catheter and advanced into the left uterine artery over a guide wire utilizing roadmapping technique. Selective arteriography of the left uterine artery was performed through the microcatheter. Left uterine artery embolization was then performed with installation of microsphere particles. Follow-up arteriography was performed after embolization.  The microcatheter was removed. The diagnostic catheter was then retracted and used to selectively catheterize the right internal iliac artery. Arteriography was performed. The microcatheter was then reintroduced and advanced into the right uterine artery over a guidewire utilizing roadmapping technique. Selective arteriography of the right uterine artery was then performed through the microcatheter.  Right uterine artery embolization was then performed with  installation of microsphere particles. Follow-up arteriography was  performed after embolization.  Right femoral arteriotomy hemostasis:  Cordis ExoSeal  COMPLICATIONS: None.  FINDINGS: Bilateral uterine arteriography shows multiple enlarged hypervascular trunks supplying uterine fibroids. Left uterine artery embolization was performed utilizing 2 vials of 500-700 micron sized and 2/3 vial of 700-900 micron sized Embosphere particles. Completion arteriography demonstrates adequate occlusion of branches supplying uterine fibroids.  Right uterine artery embolization was performed utilizing tube vials of 500 -700 micron sized and 1-1/3 vials of 700-900 micron sized Embosphere particles. Completion arteriography demonstrates adequate occlusion of branches supplying uterine fibroids.  Adequate hemostasis was achieved at the femoral arteriotomy site.  IMPRESSION: Successful bilateral uterine artery embolization to treat symptomatic uterine fibroid disease. Adequate occlusion of branch vessels supplying uterine fibroids was achieved with microsphere particle embolization. The patient was admitted for overnight observation for treatment of post embolization symptoms. Initial clinical follow-up will be performed in 2 weeks.   Electronically Signed   By: Aletta Edouard M.D.   On: 02/26/2013 17:00   Ir Angiogram Selective Each Additional Vessel  02/26/2013   CLINICAL DATA:  Symptomatic uterine fibroid disease with menorrhagia.  EXAM: 1. ULTRASOUND GUIDANCE FOR VASCULAR ACCESS OF THE RIGHT COMMON FEMORAL ARTERY 2. SELECTIVE LEFT INTERNAL ILIAC AND UTERINE ARTERIOGRAPHY 3. SELECTIVE RIGHT INTERNAL ILIAC AND UTERINE ARTERIOGRAPHY 4. TRANSCATHETER EMBOLIZATION OF BILATERAL UTERINE ARTERIES FOR ORGAN ISCHEMIA 5. FOLLOW-UP ARTERIOGRAPHY OF THE LEFT UTERINE ARTERY AFTER EMBOLIZATION 6. FOLLOW-UP ARTERIOGRAPHY OF THE RIGHT UTERINE ARTERY AFTER EMBOLIZATION  ANESTHESIA/SEDATION: 2.0 Mg IV Versed; 100 mcg IV Fentanyl.  Total Moderate  Sedation Time: 90  Minutes.  Contrast Volume: 70 ml OMNIPAQUE 300  Additional Medications: 2 g IV Ancef, 10 mg IV Decadron and 30 mg IV Toradol.  FLUOROSCOPY TIME:  24 min and 48 seconds.  PROCEDURE: The procedure, risks, benefits, and alternatives were explained to the patient. Questions regarding the procedure were encouraged and answered. The patient understands and consents to the procedure.  The right groin was prepped with Betadine in a sterile fashion, and a sterile drape was applied covering the operative field. A sterile gown and sterile gloves were used for the procedure. Local anesthesia was provided with 1% Lidocaine.  Ultrasound was used to confirm patency of the right common femoral artery. After a small skin incision, a 21 gauge needle was advanced into the right common femoral artery under direct ultrasound guidance. Ultrasound image documentation was performed. After establishing guide wire access, a 5-French sheath was placed.  A 5 Fr diagnostic catheter was advanced over a guidewire into the distal abdominal aorta. The catheter was then used to selectively catheterize the left common iliac artery followed by the left internal iliac artery. Arteriography was performed.  A 2.8 Fr coaxial microcatheter was then introduced through the diagnostic catheter and advanced into the left uterine artery over a guide wire utilizing roadmapping technique. Selective arteriography of the left uterine artery was performed through the microcatheter. Left uterine artery embolization was then performed with installation of microsphere particles. Follow-up arteriography was performed after embolization.  The microcatheter was removed. The diagnostic catheter was then retracted and used to selectively catheterize the right internal iliac artery. Arteriography was performed. The microcatheter was then reintroduced and advanced into the right uterine artery over a guidewire utilizing roadmapping technique. Selective  arteriography of the right uterine artery was then performed through the microcatheter.  Right uterine artery embolization was then performed with installation of microsphere particles. Follow-up arteriography was performed after embolization.  Right femoral arteriotomy hemostasis:  Cordis ExoSeal  COMPLICATIONS: None.  FINDINGS: Bilateral uterine arteriography shows multiple enlarged hypervascular trunks supplying uterine fibroids. Left uterine artery embolization was performed utilizing 2 vials of 500-700 micron sized and 2/3 vial of 700-900 micron sized Embosphere particles. Completion arteriography demonstrates adequate occlusion of branches supplying uterine fibroids.  Right uterine artery embolization was performed utilizing tube vials of 500 -700 micron sized and 1-1/3 vials of 700-900 micron sized Embosphere particles. Completion arteriography demonstrates adequate occlusion of branches supplying uterine fibroids.  Adequate hemostasis was achieved at the femoral arteriotomy site.  IMPRESSION: Successful bilateral uterine artery embolization to treat symptomatic uterine fibroid disease. Adequate occlusion of branch vessels supplying uterine fibroids was achieved with microsphere particle embolization. The patient was admitted for overnight observation for treatment of post embolization symptoms. Initial clinical follow-up will be performed in 2 weeks.   Electronically Signed   By: Aletta Edouard M.D.   On: 02/26/2013 17:00   Ir Angiogram Selective Each Additional Vessel  02/26/2013   CLINICAL DATA:  Symptomatic uterine fibroid disease with menorrhagia.  EXAM: 1. ULTRASOUND GUIDANCE FOR VASCULAR ACCESS OF THE RIGHT COMMON FEMORAL ARTERY 2. SELECTIVE LEFT INTERNAL ILIAC AND UTERINE ARTERIOGRAPHY 3. SELECTIVE RIGHT INTERNAL ILIAC AND UTERINE ARTERIOGRAPHY 4. TRANSCATHETER EMBOLIZATION OF BILATERAL UTERINE ARTERIES FOR ORGAN ISCHEMIA 5. FOLLOW-UP ARTERIOGRAPHY OF THE LEFT UTERINE ARTERY AFTER EMBOLIZATION 6.  FOLLOW-UP ARTERIOGRAPHY OF THE RIGHT UTERINE ARTERY AFTER EMBOLIZATION  ANESTHESIA/SEDATION: 2.0 Mg IV Versed; 100 mcg IV Fentanyl.  Total Moderate Sedation Time: 90  Minutes.  Contrast Volume: 70 ml OMNIPAQUE 300  Additional Medications: 2 g IV Ancef, 10 mg IV Decadron and 30 mg IV Toradol.  FLUOROSCOPY TIME:  24 min and 48 seconds.  PROCEDURE: The procedure, risks, benefits, and alternatives were explained to the patient. Questions regarding the procedure were encouraged and answered. The patient understands and consents to the procedure.  The right groin was prepped with Betadine in a sterile fashion, and a sterile drape was applied covering the operative field. A sterile gown and sterile gloves were used for the procedure. Local anesthesia was provided with 1% Lidocaine.  Ultrasound was used to confirm patency of the right common femoral artery. After a small skin incision, a 21 gauge needle was advanced into the right common femoral artery under direct ultrasound guidance. Ultrasound image documentation was performed. After establishing guide wire access, a 5-French sheath was placed.  A 5 Fr diagnostic catheter was advanced over a guidewire into the distal abdominal aorta. The catheter was then used to selectively catheterize the left common iliac artery followed by the left internal iliac artery. Arteriography was performed.  A 2.8 Fr coaxial microcatheter was then introduced through the diagnostic catheter and advanced into the left uterine artery over a guide wire utilizing roadmapping technique. Selective arteriography of the left uterine artery was performed through the microcatheter. Left uterine artery embolization was then performed with installation of microsphere particles. Follow-up arteriography was performed after embolization.  The microcatheter was removed. The diagnostic catheter was then retracted and used to selectively catheterize the right internal iliac artery. Arteriography was performed.  The microcatheter was then reintroduced and advanced into the right uterine artery over a guidewire utilizing roadmapping technique. Selective arteriography of the right uterine artery was then performed through the microcatheter.  Right uterine artery embolization was then performed with installation of microsphere particles. Follow-up arteriography was performed after embolization.  Right femoral arteriotomy hemostasis:  Cordis ExoSeal  COMPLICATIONS: None.  FINDINGS: Bilateral uterine arteriography shows multiple enlarged hypervascular trunks supplying uterine fibroids. Left uterine  artery embolization was performed utilizing 2 vials of 500-700 micron sized and 2/3 vial of 700-900 micron sized Embosphere particles. Completion arteriography demonstrates adequate occlusion of branches supplying uterine fibroids.  Right uterine artery embolization was performed utilizing tube vials of 500 -700 micron sized and 1-1/3 vials of 700-900 micron sized Embosphere particles. Completion arteriography demonstrates adequate occlusion of branches supplying uterine fibroids.  Adequate hemostasis was achieved at the femoral arteriotomy site.  IMPRESSION: Successful bilateral uterine artery embolization to treat symptomatic uterine fibroid disease. Adequate occlusion of branch vessels supplying uterine fibroids was achieved with microsphere particle embolization. The patient was admitted for overnight observation for treatment of post embolization symptoms. Initial clinical follow-up will be performed in 2 weeks.   Electronically Signed   By: Aletta Edouard M.D.   On: 02/26/2013 17:00   Ir US Guide Vasc Access Right  02/26/2013   CLINICAL DATA:  Symptomatic uterine fibroid disease with menorrhagia.  EXAM: 1. ULTRASOUND GUIDANCE FOR VASCULAR ACCESS OF THE RIGHT COMMON FEMORAL ARTERY 2. SELECTIVE LEFT INTERNAL ILIAC AND UTERINE ARTERIOGRAPHY 3. SELECTIVE RIGHT INTERNAL ILIAC AND UTERINE ARTERIOGRAPHY 4. TRANSCATHETER  EMBOLIZATION OF BILATERAL UTERINE ARTERIES FOR ORGAN ISCHEMIA 5. FOLLOW-UP ARTERIOGRAPHY OF THE LEFT UTERINE ARTERY AFTER EMBOLIZATION 6. FOLLOW-UP ARTERIOGRAPHY OF THE RIGHT UTERINE ARTERY AFTER EMBOLIZATION  ANESTHESIA/SEDATION: 2.0 Mg IV Versed; 100 mcg IV Fentanyl.  Total Moderate Sedation Time: 90  Minutes.  Contrast Volume: 70 ml OMNIPAQUE 300  Additional Medications: 2 g IV Ancef, 10 mg IV Decadron and 30 mg IV Toradol.  FLUOROSCOPY TIME:  24 min and 48 seconds.  PROCEDURE: The procedure, risks, benefits, and alternatives were explained to the patient. Questions regarding the procedure were encouraged and answered. The patient understands and consents to the procedure.  The right groin was prepped with Betadine in a sterile fashion, and a sterile drape was applied covering the operative field. A sterile gown and sterile gloves were used for the procedure. Local anesthesia was provided with 1% Lidocaine.  Ultrasound was used to confirm patency of the right common femoral artery. After a small skin incision, a 21 gauge needle was advanced into the right common femoral artery under direct ultrasound guidance. Ultrasound image documentation was performed. After establishing guide wire access, a 5-French sheath was placed.  A 5 Fr diagnostic catheter was advanced over a guidewire into the distal abdominal aorta. The catheter was then used to selectively catheterize the left common iliac artery followed by the left internal iliac artery. Arteriography was performed.  A 2.8 Fr coaxial microcatheter was then introduced through the diagnostic catheter and advanced into the left uterine artery over a guide wire utilizing roadmapping technique. Selective arteriography of the left uterine artery was performed through the microcatheter. Left uterine artery embolization was then performed with installation of microsphere particles. Follow-up arteriography was performed after embolization.  The microcatheter was removed.  The diagnostic catheter was then retracted and used to selectively catheterize the right internal iliac artery. Arteriography was performed. The microcatheter was then reintroduced and advanced into the right uterine artery over a guidewire utilizing roadmapping technique. Selective arteriography of the right uterine artery was then performed through the microcatheter.  Right uterine artery embolization was then performed with installation of microsphere particles. Follow-up arteriography was performed after embolization.  Right femoral arteriotomy hemostasis:  Cordis ExoSeal  COMPLICATIONS: None.  FINDINGS: Bilateral uterine arteriography shows multiple enlarged hypervascular trunks supplying uterine fibroids. Left uterine artery embolization was performed utilizing 2 vials of 500-700 micron sized and 2/3 vial  of 700-900 micron sized Embosphere particles. Completion arteriography demonstrates adequate occlusion of branches supplying uterine fibroids.  Right uterine artery embolization was performed utilizing tube vials of 500 -700 micron sized and 1-1/3 vials of 700-900 micron sized Embosphere particles. Completion arteriography demonstrates adequate occlusion of branches supplying uterine fibroids.  Adequate hemostasis was achieved at the femoral arteriotomy site.  IMPRESSION: Successful bilateral uterine artery embolization to treat symptomatic uterine fibroid disease. Adequate occlusion of branch vessels supplying uterine fibroids was achieved with microsphere particle embolization. The patient was admitted for overnight observation for treatment of post embolization symptoms. Initial clinical follow-up will be performed in 2 weeks.   Electronically Signed   By: Aletta Edouard M.D.   On: 02/26/2013 17:00   Ir Embo Tumor Organ Ischemia Infarct Inc Guide Roadmapping  02/26/2013   CLINICAL DATA:  Symptomatic uterine fibroid disease with menorrhagia.  EXAM: 1. ULTRASOUND GUIDANCE FOR VASCULAR ACCESS OF THE RIGHT  COMMON FEMORAL ARTERY 2. SELECTIVE LEFT INTERNAL ILIAC AND UTERINE ARTERIOGRAPHY 3. SELECTIVE RIGHT INTERNAL ILIAC AND UTERINE ARTERIOGRAPHY 4. TRANSCATHETER EMBOLIZATION OF BILATERAL UTERINE ARTERIES FOR ORGAN ISCHEMIA 5. FOLLOW-UP ARTERIOGRAPHY OF THE LEFT UTERINE ARTERY AFTER EMBOLIZATION 6. FOLLOW-UP ARTERIOGRAPHY OF THE RIGHT UTERINE ARTERY AFTER EMBOLIZATION  ANESTHESIA/SEDATION: 2.0 Mg IV Versed; 100 mcg IV Fentanyl.  Total Moderate Sedation Time: 90  Minutes.  Contrast Volume: 70 ml OMNIPAQUE 300  Additional Medications: 2 g IV Ancef, 10 mg IV Decadron and 30 mg IV Toradol.  FLUOROSCOPY TIME:  24 min and 48 seconds.  PROCEDURE: The procedure, risks, benefits, and alternatives were explained to the patient. Questions regarding the procedure were encouraged and answered. The patient understands and consents to the procedure.  The right groin was prepped with Betadine in a sterile fashion, and a sterile drape was applied covering the operative field. A sterile gown and sterile gloves were used for the procedure. Local anesthesia was provided with 1% Lidocaine.  Ultrasound was used to confirm patency of the right common femoral artery. After a small skin incision, a 21 gauge needle was advanced into the right common femoral artery under direct ultrasound guidance. Ultrasound image documentation was performed. After establishing guide wire access, a 5-French sheath was placed.  A 5 Fr diagnostic catheter was advanced over a guidewire into the distal abdominal aorta. The catheter was then used to selectively catheterize the left common iliac artery followed by the left internal iliac artery. Arteriography was performed.  A 2.8 Fr coaxial microcatheter was then introduced through the diagnostic catheter and advanced into the left uterine artery over a guide wire utilizing roadmapping technique. Selective arteriography of the left uterine artery was performed through the microcatheter. Left uterine artery  embolization was then performed with installation of microsphere particles. Follow-up arteriography was performed after embolization.  The microcatheter was removed. The diagnostic catheter was then retracted and used to selectively catheterize the right internal iliac artery. Arteriography was performed. The microcatheter was then reintroduced and advanced into the right uterine artery over a guidewire utilizing roadmapping technique. Selective arteriography of the right uterine artery was then performed through the microcatheter.  Right uterine artery embolization was then performed with installation of microsphere particles. Follow-up arteriography was performed after embolization.  Right femoral arteriotomy hemostasis:  Cordis ExoSeal  COMPLICATIONS: None.  FINDINGS: Bilateral uterine arteriography shows multiple enlarged hypervascular trunks supplying uterine fibroids. Left uterine artery embolization was performed utilizing 2 vials of 500-700 micron sized and 2/3 vial of 700-900 micron sized Embosphere particles. Completion arteriography demonstrates adequate occlusion  of branches supplying uterine fibroids.  Right uterine artery embolization was performed utilizing tube vials of 500 -700 micron sized and 1-1/3 vials of 700-900 micron sized Embosphere particles. Completion arteriography demonstrates adequate occlusion of branches supplying uterine fibroids.  Adequate hemostasis was achieved at the femoral arteriotomy site.  IMPRESSION: Successful bilateral uterine artery embolization to treat symptomatic uterine fibroid disease. Adequate occlusion of branch vessels supplying uterine fibroids was achieved with microsphere particle embolization. The patient was admitted for overnight observation for treatment of post embolization symptoms. Initial clinical follow-up will be performed in 2 weeks.   Electronically Signed   By: Aletta Edouard M.D.   On: 02/26/2013 17:00    Anti-infectives: Anti-infectives    Start     Dose/Rate Route Frequency Ordered Stop   02/26/13 0745  ceFAZolin (ANCEF) IVPB 2 g/50 mL premix     2 g 100 mL/hr over 30 Minutes Intravenous  Once 02/26/13 0732 02/26/13 1024      Assessment/Plan Status post Uterine Fibroid Embolization d/c foley Ambulate   LOS: 0 days    Artrice Kraker T 02/26/2013

## 2013-02-26 NOTE — Procedures (Signed)
Procedure:  Uterine fibroid embolization Access:  Right CFA, 5 Fr sheath Findings:  Hypervascular supply to uterus and uterine fibroids.  Left uterine artery embolized with 2 vials 500-700 micron and 2/3 vial of 700-900 micron Embospheres. Right uterine artery embolized with 2 vials of 500-700 micron and 1 1/3 vials 700-900 micron Embospheres.  Good stasis endpoints achieved. Closure:  Cordis ExoSeal

## 2013-02-26 NOTE — Progress Notes (Signed)
Report given to Rennie Natter , receiving RN to room 1309

## 2013-02-27 ENCOUNTER — Other Ambulatory Visit: Payer: Self-pay | Admitting: Radiology

## 2013-02-27 DIAGNOSIS — D219 Benign neoplasm of connective and other soft tissue, unspecified: Secondary | ICD-10-CM

## 2013-02-27 LAB — CBC
HEMATOCRIT: 28.3 % — AB (ref 36.0–46.0)
Hemoglobin: 8.4 g/dL — ABNORMAL LOW (ref 12.0–15.0)
MCH: 24 pg — ABNORMAL LOW (ref 26.0–34.0)
MCHC: 29.7 g/dL — ABNORMAL LOW (ref 30.0–36.0)
MCV: 80.9 fL (ref 78.0–100.0)
Platelets: 391 10*3/uL (ref 150–400)
RBC: 3.5 MIL/uL — AB (ref 3.87–5.11)
RDW: 17.1 % — ABNORMAL HIGH (ref 11.5–15.5)
WBC: 11.1 10*3/uL — ABNORMAL HIGH (ref 4.0–10.5)

## 2013-02-27 MED ORDER — ONDANSETRON HCL 4 MG PO TABS: 4.0000 mg | ORAL_TABLET | Freq: Three times a day (TID) | ORAL | Status: DC | PRN

## 2013-02-27 MED ORDER — IBUPROFEN 200 MG PO TABS: 600.0000 mg | ORAL_TABLET | Freq: Four times a day (QID) | ORAL | Status: DC | PRN

## 2013-02-27 MED ORDER — HYDROCODONE-ACETAMINOPHEN 5-325 MG PO TABS: 1.0000 | ORAL_TABLET | ORAL | Status: DC | PRN

## 2013-02-27 MED ORDER — DSS 100 MG PO CAPS: 100.0000 mg | ORAL_CAPSULE | Freq: Two times a day (BID) | ORAL | Status: DC

## 2013-02-27 NOTE — Discharge Instructions (Signed)
Fibroids Fibroids are lumps (tumors) that can occur any place in a woman's body. These lumps are not cancerous. Fibroids vary in size, weight, and where they grow. HOME CARE  Do not take aspirin.  Write down the number of pads or tampons you use during your period. Tell your doctor. This can help determine the best treatment for you. GET HELP RIGHT AWAY IF:  You have pain in your lower belly (abdomen) that is not helped with medicine.  You have cramps that are not helped with medicine.  You have more bleeding between or during your period.  You feel lightheaded or pass out (faint).  Your lower belly pain gets worse. MAKE SURE YOU:  Understand these instructions.  Will watch your condition.  Will get help right away if you are not doing well or get worse. Document Released: 02/19/2010 Document Revised: 04/11/2011 Document Reviewed: 02/19/2010 Exit[employer] Patient Information 2014 [employer].  

## 2013-02-27 NOTE — Discharge Summary (Signed)
Physician Discharge Summary  Patient ID: NAHAL WANLESS MRN: [id number] DOB/AGE: 06-Mar-1965 48 y.o.  Admit date: 02/26/2013 Discharge date: 02/27/2013  Admission Diagnoses: Active Problems:   Fibroids   Menorrhagia  Discharge Diagnoses:  Active Problems:   Fibroids   Menorrhagia  S/p [place].  Procedures: Uterine fibroid embolization  Discharged Condition: Good, stable.   Hospital Course: Valerie Edwards is an 48 y.o. female with hx of menorrhagia and fibroids. She was seen by Dr. Annamaria Boots for evaluation of uterine artery embolization in consult on 12/2012  and was scheduled for elective outpatient procedure on 02/26/13. She underwent the procedure with no complications and was admitted overnight for serial labs and vitals. Labs and vitals today were reviewed with no significant findings. The patient today states her pain is rated 2/10 with lower pelvic cramping. She denies any nausea or vomiting. She has urinated after foley removal and has had breakfast. She has ambulated the halls with no complications. She denies any fever, chills, right groin pain or bleeding. The patient states she feels ready to be discharged home and will follow up with Dr. Kathlene Cote at the clinic in 2 weeks.   Consults: None.  Discharge Exam: Blood pressure 106/57, pulse 64, temperature 98.1 F (36.7 C), temperature source Oral, resp. rate 16, height 5\' 4"  (1.626 m), weight 149 lb (67.586 kg), last menstrual period 02/10/2013, SpO2 100.00%.  General: A&Ox3, NAD, sitting up in bed eating. Heart: RRR without M/G/R Lungs: CTA bilaterally Abd: Soft, ND, NT, (+) BS, right CFA access site dressing C/D/I, no signs of bleeding, hematoma or infection.  Extremities: FROM all extremities, no edema, DP intact 2+ bilaterally.   Disposition:   Discharge Orders   Future Orders Complete By Expires   Call MD for:  difficulty breathing, headache or visual disturbances  As directed    Call MD for:  extreme fatigue  As directed     Call MD for:  hives  As directed    Call MD for:  persistant dizziness or light-headedness  As directed    Call MD for:  persistant nausea and vomiting  As directed    Call MD for:  redness, tenderness, or signs of infection (pain, swelling, redness, odor or green/yellow discharge around incision site)  As directed    Call MD for:  severe uncontrolled pain  As directed    Call MD for:  temperature >100.4  As directed    Diet - low sodium heart healthy  As directed    Driving Restrictions  As directed    Comments:     No driving 2 days   Increase activity slowly  As directed    Lifting restrictions  As directed    Comments:     No lifting over 10 lbs x 3 days   Remove dressing in 24 hours  As directed        Medication List         DSS 100 MG Caps  Take 100 mg by mouth 2 (two) times daily.     ferrous sulfate 325 (65 FE) MG tablet  Take 325 mg by mouth 2 (two) times daily with a meal.     HYDROcodone-acetaminophen 5-325 MG per tablet  Commonly known as:  NORCO  Take 1 tablet by mouth every 4 (four) hours as needed for moderate pain or severe pain.     ibuprofen 200 MG tablet  Commonly known as:  ADVIL  Take 3 tablets (600 mg total) by mouth  every 6 (six) hours as needed for mild pain or cramping (x 48 hours then PRN).     levothyroxine 175 MCG tablet  Commonly known as:  SYNTHROID, LEVOTHROID  Take 175 mcg by mouth daily before breakfast.     ondansetron 4 MG tablet  Commonly known as:  ZOFRAN  Take 1 tablet (4 mg total) by mouth every 8 (eight) hours as needed for nausea or vomiting.           Follow-up Information   Follow up with [facility] T, MD In 2 weeks. (Our office will call with appoinment time [phone])    Specialty:  Interventional Radiology   Contact information:   [address] [place] [address] [phone]       Signed: Hedy Jacob PA-C 02/27/2013, 9:41 AM

## 2013-02-27 NOTE — Progress Notes (Signed)
Pt. Was discharged home. She was given her discharge instructions, prescriptions, and all questions were answered. She was transported home by family. 

## 2013-02-28 NOTE — Discharge Summary (Signed)
Agree.  Procedure well tolerated.  Clinic follow up in 2-3 weeks.

## 2013-03-12 ENCOUNTER — Ambulatory Visit
Admission: RE | Admit: 2013-03-12 | Discharge: 2013-03-12 | Disposition: A | Discharge status: Home / Self Care | Payer: 59 | Source: Ambulatory Visit | Attending: Radiology | Admitting: Radiology

## 2013-03-12 ENCOUNTER — Other Ambulatory Visit (HOSPITAL_COMMUNITY): Payer: 59

## 2013-03-12 DIAGNOSIS — D219 Benign neoplasm of connective and other soft tissue, unspecified: Secondary | ICD-10-CM

## 2013-03-12 NOTE — Progress Notes (Signed)
2WK POST [place] PROCEDURE.  PT HAD LIGHT PERIOD ON 02-28-13 THAT LASTED 3 DAYS  AND SHE HAS STARTED AGAIN TODAY.  SEEMS LIGHTER AND DECREASED BLEEDING.   PT DENIES CRAMPING, PAIN, D/C, FEVER, CHILLS, ABN ODOR.    SHE IS GREAT!

## 2013-05-22 ENCOUNTER — Telehealth: Payer: Self-pay | Admitting: Radiology

## 2013-05-22 NOTE — Telephone Encounter (Signed)
For status up date 3 month post [place].  Patient states that symptoms are improving w/ decreased menstrual flow & minimal small cycles during menses.  Denies cramping.  States that she prefers to wait for follow up at 6 months post [place].  Tarika Mckethan Riki Rusk, RN 05/22/2013 2:16 PM

## 2013-07-24 ENCOUNTER — Other Ambulatory Visit (HOSPITAL_COMMUNITY): Payer: Self-pay | Admitting: Interventional Radiology

## 2013-07-24 DIAGNOSIS — D251 Intramural leiomyoma of uterus: Secondary | ICD-10-CM

## 2013-08-01 ENCOUNTER — Other Ambulatory Visit: Payer: Self-pay | Admitting: Obstetrics & Gynecology

## 2013-08-01 DIAGNOSIS — D251 Intramural leiomyoma of uterus: Secondary | ICD-10-CM

## 2013-08-08 ENCOUNTER — Encounter: Payer: Self-pay | Admitting: Radiology

## 2015-02-20 ENCOUNTER — Other Ambulatory Visit: Payer: Self-pay

## 2015-02-20 DIAGNOSIS — Z1231 Encounter for screening mammogram for malignant neoplasm of breast: Secondary | ICD-10-CM

## 2015-05-07 ENCOUNTER — Ambulatory Visit (INDEPENDENT_AMBULATORY_CARE_PROVIDER_SITE_OTHER): Payer: 59

## 2015-05-07 ENCOUNTER — Ambulatory Visit (HOSPITAL_COMMUNITY)
Admission: EM | Admit: 2015-05-07 | Discharge: 2015-05-07 | Disposition: A | Discharge status: Home / Self Care | Payer: 59 | Attending: Family Medicine | Admitting: Family Medicine

## 2015-05-07 ENCOUNTER — Encounter (HOSPITAL_COMMUNITY): Payer: Self-pay | Admitting: Emergency Medicine

## 2015-05-07 DIAGNOSIS — J069 Acute upper respiratory infection, unspecified: Secondary | ICD-10-CM

## 2015-05-07 DIAGNOSIS — Z79899 Other long term (current) drug therapy: Secondary | ICD-10-CM | POA: Insufficient documentation

## 2015-05-07 DIAGNOSIS — D219 Benign neoplasm of connective and other soft tissue, unspecified: Secondary | ICD-10-CM | POA: Insufficient documentation

## 2015-05-07 DIAGNOSIS — Z9889 Other specified postprocedural states: Secondary | ICD-10-CM | POA: Insufficient documentation

## 2015-05-07 DIAGNOSIS — R509 Fever, unspecified: Secondary | ICD-10-CM | POA: Diagnosis present

## 2015-05-07 DIAGNOSIS — N920 Excessive and frequent menstruation with regular cycle: Secondary | ICD-10-CM | POA: Diagnosis not present

## 2015-05-07 DIAGNOSIS — J029 Acute pharyngitis, unspecified: Secondary | ICD-10-CM | POA: Insufficient documentation

## 2015-05-07 DIAGNOSIS — D649 Anemia, unspecified: Secondary | ICD-10-CM | POA: Insufficient documentation

## 2015-05-07 DIAGNOSIS — R05 Cough: Secondary | ICD-10-CM | POA: Insufficient documentation

## 2015-05-07 LAB — POCT RAPID STREP A: Streptococcus, Group A Screen (Direct): NEGATIVE

## 2015-05-07 MED ORDER — IPRATROPIUM BROMIDE 0.06 % NA SOLN: 2.0000 | Freq: Four times a day (QID) | NASAL | Status: DC

## 2015-05-07 NOTE — ED Notes (Signed)
The patient presented to the [facility] with a complaint of a sore throat, fever, and cough x 1 week.

## 2015-05-07 NOTE — Discharge Instructions (Signed)
Drink plenty of fluids as discussed, use medicine as prescribed, and mucinex or delsym for cough. Return or see your doctor if further problems °

## 2015-05-07 NOTE — ED Provider Notes (Signed)
CSN: [id number]     Arrival date & time 05/07/15  1653 History   First MD Initiated Contact with Patient 05/07/15 1747     Chief Complaint  Patient presents with  . Fever  . Sore Throat  . Cough   (Consider location/radiation/quality/duration/timing/severity/associated sxs/prior Treatment) Patient is a 50 y.o. female presenting with cough. The history is provided by the patient.  Cough Cough characteristics:  Non-productive and dry Severity:  Moderate Onset quality:  Sudden Duration:  5 days Progression:  Unchanged Chronicity:  New Smoker: no   Context: upper respiratory infection   Context: not smoke exposure   Associated symptoms: fever and sore throat   Associated symptoms: no shortness of breath     Past Medical History  Diagnosis Date  . Anemia   . Fibroids   . Menorrhagia    Past Surgical History  Procedure Laterality Date  . Uterine artery embolization  02/26/2013   History reviewed. No pertinent family history. Social History  Substance Use Topics  . Smoking status: Never Smoker   . Smokeless tobacco: Never Used  . Alcohol Use: No   OB History    No data available     Review of Systems  Constitutional: Positive for fever.  HENT: Positive for congestion, postnasal drip and sore throat.   Respiratory: Positive for cough. Negative for shortness of breath.   Cardiovascular: Negative.   Gastrointestinal: Negative.   Genitourinary: Negative.   All other systems reviewed and are negative.   Allergies  Review of patient's allergies indicates no known allergies.  Home Medications   Prior to Admission medications   Medication Sig Start Date End Date Taking? Authorizing Provider  levothyroxine (SYNTHROID, LEVOTHROID) 175 MCG tablet Take 175 mcg by mouth daily before breakfast.   Yes Historical Provider, MD  ondansetron (ZOFRAN) 4 MG tablet Take 1 tablet (4 mg total) by mouth every 8 (eight) hours as needed for nausea or vomiting. 02/27/13  Yes Hedy Jacob, PA-C  Docusate Sodium (DSS) 100 MG CAPS Take 100 mg by mouth 2 (two) times daily. 02/27/13   Hedy Jacob, PA-C  ferrous sulfate 325 (65 FE) MG tablet Take 325 mg by mouth 2 (two) times daily with a meal.    Historical Provider, MD  HYDROcodone-acetaminophen (NORCO) 5-325 MG per tablet Take 1 tablet by mouth every 4 (four) hours as needed for moderate pain or severe pain. 02/27/13   Hedy Jacob, PA-C  ibuprofen (ADVIL) 200 MG tablet Take 3 tablets (600 mg total) by mouth every 6 (six) hours as needed for mild pain or cramping (x 48 hours then PRN). 02/27/13   Hedy Jacob, PA-C  ipratropium (ATROVENT) 0.06 % nasal spray Place 2 sprays into both nostrils 4 (four) times daily. 05/07/15   Billy Fischer, MD   Meds Ordered and Administered this Visit  Medications - No data to display  BP 121/62 mmHg  Pulse 65  Temp(Src) 99.6 F (37.6 C) (Oral)  Resp 16  SpO2 98%  LMP 04/22/2015 (Approximate) No data found.   Physical Exam  Constitutional: She is oriented to person, place, and time. She appears well-developed and well-nourished.  HENT:  Right Ear: External ear normal.  Left Ear: External ear normal.  Mouth/Throat: Oropharynx is clear and moist.  Eyes: Conjunctivae are normal. Pupils are equal, round, and reactive to light.  Neck: Normal range of motion. Neck supple.  Cardiovascular: Normal rate, regular rhythm, normal heart sounds and intact distal pulses.   Pulmonary/Chest:  Effort normal and breath sounds normal.  Abdominal: Soft. Bowel sounds are normal.  Lymphadenopathy:    She has no cervical adenopathy.  Neurological: She is alert and oriented to person, place, and time.  Skin: Skin is warm and dry.  Nursing note and vitals reviewed.   ED Course  Procedures (including critical care time)  Labs Review Labs Reviewed  CULTURE, GROUP A STREP [facility])  POCT RAPID STREP A    Imaging Review Dg Chest 2 View  05/07/2015  CLINICAL DATA:  Cough EXAM: CHEST  2 VIEW  COMPARISON:  01/22/2012 FINDINGS: Cardiomediastinal silhouette is unremarkable. No acute infiltrate or pleural effusion. No pulmonary edema. Mild mid thoracic dextroscoliosis. IMPRESSION: No active cardiopulmonary disease. Mild mid thoracic dextroscoliosis. Electronically Signed   By: Lahoma Crocker M.D.   On: 05/07/2015 18:51    X-rays reviewed and report per radiologist.  Visual Acuity Review  Right Eye Distance:   Left Eye Distance:   Bilateral Distance:    Right Eye Near:   Left Eye Near:    Bilateral Near:         MDM   1. URI (upper respiratory infection)    Meds ordered this encounter  Medications  . ipratropium (ATROVENT) 0.06 % nasal spray    Sig: Place 2 sprays into both nostrils 4 (four) times daily.    Dispense:  15 mL    Refill:  1        Billy Fischer, MD 05/07/15 [phone]

## 2015-05-10 LAB — CULTURE, GROUP A STREP (THRC)

## 2016-04-06 ENCOUNTER — Encounter: Payer: Self-pay | Admitting: Physician Assistant

## 2016-04-18 ENCOUNTER — Ambulatory Visit (INDEPENDENT_AMBULATORY_CARE_PROVIDER_SITE_OTHER): Payer: 59 | Admitting: Physician Assistant

## 2016-04-18 ENCOUNTER — Encounter: Payer: Self-pay | Admitting: Physician Assistant

## 2016-04-18 ENCOUNTER — Other Ambulatory Visit (INDEPENDENT_AMBULATORY_CARE_PROVIDER_SITE_OTHER): Payer: 59

## 2016-04-18 ENCOUNTER — Encounter (INDEPENDENT_AMBULATORY_CARE_PROVIDER_SITE_OTHER): Payer: Self-pay

## 2016-04-18 VITALS — BP 90/62 | HR 80 | Ht 64.0 in | Wt 151.0 lb

## 2016-04-18 DIAGNOSIS — K5900 Constipation, unspecified: Secondary | ICD-10-CM | POA: Diagnosis not present

## 2016-04-18 DIAGNOSIS — R194 Change in bowel habit: Secondary | ICD-10-CM | POA: Diagnosis not present

## 2016-04-18 DIAGNOSIS — D509 Iron deficiency anemia, unspecified: Secondary | ICD-10-CM

## 2016-04-18 LAB — HEMOGLOBIN

## 2016-04-18 LAB — IBC PANEL
Iron: 10 ug/dL — ABNORMAL LOW (ref 42–145)
Saturation Ratios: 2 % — ABNORMAL LOW (ref 20.0–50.0)
Transferrin: 353 mg/dL (ref 212.0–360.0)

## 2016-04-18 LAB — FERRITIN: FERRITIN: 7.4 ng/mL — AB (ref 10.0–291.0)

## 2016-04-18 MED ORDER — NA SULFATE-K SULFATE-MG SULF 17.5-3.13-1.6 GM/177ML PO SOLN: 1.0000 | ORAL | 0 refills | Status: DC

## 2016-04-18 NOTE — Progress Notes (Signed)
Reviewed and agree with initial management plan.  Alethea Terhaar T. Kadien Lineman, MD FACG 

## 2016-04-18 NOTE — Patient Instructions (Addendum)
You have been scheduled for a colonoscopy. Please follow written instructions given to you at your visit today.  Please pick up your prep supplies at the pharmacy within the next 1-3 days. If you use inhalers (even only as needed), please bring them with you on the day of your procedure. Your physician has requested that you go to www.startemmi.com and enter the access code given to you at your visit today. This web site gives a general overview about your procedure. However, you should still follow specific instructions given to you by our office regarding your preparation for the procedure.  Please purchase the following medications over the counter and take as directed: Miralax 17 grams (one capful)  daily

## 2016-04-18 NOTE — Progress Notes (Signed)
Chief Complaint: Iron deficiency anemia  HPI:  Valerie Edwards is a 51 year old female with a past medical history of anemia, fibroids and menorrhagia, who was referred to our office by Dr. Precious Haws, for a complaint of iron deficiency anemia.     Per chart review, patient was recently seen in referring 28 office on 03/21/16 and describes feeling fatigued and thought this was secondary to being out of her thyroid medication, labs were done at that time showing a normal CMP and a CBC with a hemoglobin low at 7.9 with an MCV low at 66.3. Patient is maintained on ferrous sulfate 325 mg tabs 3 times a day.    Today, the patient tells me that she has been anemic for years, but this was blamed on her menorrhagia for which she had an embolization in January 2015. She describes that since that time she has not had heavy periods. She also has a history of nosebleeds, but tells me these have not been a problem until last week when she had "2". The patient describes over the past year she has had a feeling of "fullness and bloating", she believes this is related to a change in bowel habits towards constipation over this time. The patient tells me that since being on her thyroid medication over the past 2 weeks she has had a decrease in fatigue but is still "more tired than normal". Patient tells me she is supposed to be on iron 3 times a day but ran out of this prescription at least a year ago and has not been on this medication over the past year. She was told by her primary care physician recently she start back on over-the-counter iron twice daily but she has not done so yet.   Patient denies fever, chills, bright red blood in her stool, melena, weight loss, anorexia, nausea, vomiting, heartburn, reflux or abdominal pain.  Past Medical History:  Diagnosis Date  . Anemia   . Fibroids   . Menorrhagia     Past Surgical History:  Procedure Laterality Date  . UTERINE ARTERY EMBOLIZATION  02/26/2013     Current Outpatient Prescriptions  Medication Sig Dispense Refill  . ferrous sulfate 325 (65 FE) MG tablet Take 325 mg by mouth 2 (two) times daily with a meal.    . levothyroxine (SYNTHROID, LEVOTHROID) 175 MCG tablet Take 175 mcg by mouth daily before breakfast.     No current facility-administered medications for this visit.     Allergies as of 04/18/2016  . (No Known Allergies)    Family History  Problem Relation Age of Onset  . Diabetes Sister   . Heart disease Maternal Grandmother   . Colon cancer Neg Hx   . Stomach cancer Neg Hx     Social History   Social History  . Marital status: Single    Spouse name: N/A  . Number of children: N/A  . Years of education: N/A   Occupational History  . Not on file.   Social History Main Topics  . Smoking status: Never Smoker  . Smokeless tobacco: Never Used  . Alcohol use No  . Drug use: No  . Sexual activity: No   Other Topics Concern  . Not on file   Social History Narrative  . No narrative on file    Review of Systems:    Constitutional: No weight loss, fever or chills Skin: No rash  Cardiovascular: No chest pain Respiratory: No SOB Gastrointestinal: See HPI and otherwise negative  Genitourinary: No dysuria  Neurological: No headache Musculoskeletal: No new muscle or joint pain Hematologic: No bleeding or bruising Psychiatric: No history of depression or anxiety   Physical Exam:  Vital signs: BP 90/62   Pulse 80   Ht 5\' 4"  (1.626 m)   Wt 151 lb (68.5 kg)   BMI 25.92 kg/m    Constitutional:   Pleasant African American female appears to be in NAD, Well developed, Well nourished, alert and cooperative Head:  Normocephalic and atraumatic. Eyes:   PEERL, EOMI. No icterus. Conjunctiva pink. Ears:  Normal auditory acuity. Neck:  Supple Throat: Oral cavity and pharynx without inflammation, swelling or lesion.  Respiratory: Respirations even and unlabored. Lungs clear to auscultation bilaterally.   No  wheezes, crackles, or rhonchi.  Cardiovascular: Normal S1, S2. No MRG. Regular rate and rhythm. No peripheral edema, cyanosis or pallor.  Gastrointestinal:  Soft, nondistended, nontender. No rebound or guarding. Normal bowel sounds. No appreciable masses or hepatomegaly. Rectal:  Not performed.  Msk:  Symmetrical without gross deformities. Without edema, no deformity or joint abnormality.  Neurologic:  Alert and  oriented x4;  grossly normal neurologically.  Skin:   Dry and intact without significant lesions or rashes. Psychiatric: Demonstrates good judgement and reason without abnormal affect or behaviors.  RECENT LABS: See HPI  Assessment: 1. IDA: this has been chronic for the patient, blamed on her menorrhagia until January 2015 when she had an embolization, since that time she has had no further heavy periods, recent recheck by PCP showed a hemoglobin around 7.5 patient has not been on her iron supplementation as prescribed 3 times a day, does report some fatigue no shortness of breath or palpitations, does report change in bowel habits towards constipation; consider GI source of blood loss 2. Change in bowel habits: Towards constipation over the past year, patient has not been on iron supplementation, bowel movement every other day with bloating inbetween  Plan: 1. Recommend patient proceed with colonoscopy for further evaluation of iron deficiency anemia. Did discuss risks, benefits, limitations and alternatives the patient agrees to proceed. 2. Recommend the patient start over-the-counter iron twice daily 3. Ordered labs including iron studies and hemoglobin 4. Recommend patient start MiraLAX once daily, did discuss that she can take this up to 4 times daily 5. Patient to follow in clinic per Dr. Lynne Leader recommendations after time of colonoscopy  Ellouise Newer, PA-C [facility] 04/18/2016, 2:24 PM  Cc: No ref. provider found

## 2016-04-20 ENCOUNTER — Other Ambulatory Visit: Payer: Self-pay

## 2016-04-20 DIAGNOSIS — D509 Iron deficiency anemia, unspecified: Secondary | ICD-10-CM

## 2016-05-09 ENCOUNTER — Encounter (HOSPITAL_COMMUNITY): Payer: Self-pay

## 2016-05-09 ENCOUNTER — Encounter (HOSPITAL_COMMUNITY)
Admission: RE | Admit: 2016-05-09 | Discharge: 2016-05-09 | Disposition: A | Discharge status: Home / Self Care | Payer: 59 | Source: Ambulatory Visit | Attending: Gastroenterology | Admitting: Gastroenterology

## 2016-05-09 DIAGNOSIS — D509 Iron deficiency anemia, unspecified: Secondary | ICD-10-CM | POA: Diagnosis not present

## 2016-05-09 MED ORDER — SODIUM CHLORIDE 0.9 % IV SOLN
510.0000 mg | INTRAVENOUS | Status: DC
  Administered 2016-05-09: 510 mg via INTRAVENOUS
  Filled 2016-05-09: qty 17

## 2016-05-09 MED ORDER — SODIUM CHLORIDE 0.9 % IV SOLN
INTRAVENOUS | Status: DC
  Administered 2016-05-09: 13:00:00 via INTRAVENOUS

## 2016-05-09 NOTE — Discharge Instructions (Signed)
Ferumoxytol injection/Feraheme Infusion °What is this medicine? °FERUMOXYTOL is an iron complex. Iron is used to make healthy red blood cells, which carry oxygen and nutrients throughout the body. This medicine is used to treat iron deficiency anemia in people with chronic kidney disease. °This medicine may be used for other purposes; ask your health care provider or pharmacist if you have questions. °COMMON BRAND NAME(S): Feraheme °What should I tell my health care provider before I take this medicine? °They need to know if you have any of these conditions: °-anemia not caused by low iron levels °-high levels of iron in the blood °-magnetic resonance imaging (MRI) test scheduled °-an unusual or allergic reaction to iron, other medicines, foods, dyes, or preservatives °-pregnant or trying to get pregnant °-breast-feeding °How should I use this medicine? °This medicine is for injection into a vein. It is given by a health care professional in a hospital or clinic setting. °Talk to your pediatrician regarding the use of this medicine in children. Special care may be needed. °Overdosage: If you think you have taken too much of this medicine contact a poison control center or emergency room at once. °NOTE: This medicine is only for you. Do not share this medicine with others. °What if I miss a dose? °It is important not to miss your dose. Call your doctor or health care professional if you are unable to keep an appointment. °What may interact with this medicine? °This medicine may interact with the following medications: °-other iron products °This list may not describe all possible interactions. Give your health care provider a list of all the medicines, herbs, non-prescription drugs, or dietary supplements you use. Also tell them if you smoke, drink alcohol, or use illegal drugs. Some items may interact with your medicine. °What should I watch for while using this medicine? °Visit your doctor or healthcare  professional regularly. Tell your doctor or healthcare professional if your symptoms do not start to get better or if they get worse. You may need blood work done while you are taking this medicine. °You may need to follow a special diet. Talk to your doctor. Foods that contain iron include: whole grains/cereals, dried fruits, beans, or peas, leafy green vegetables, and organ meats (liver, kidney). °What side effects may I notice from receiving this medicine? °Side effects that you should report to your doctor or health care professional as soon as possible: °-allergic reactions like skin rash, itching or hives, swelling of the face, lips, or tongue °-breathing problems °-changes in blood pressure °-feeling faint or lightheaded, falls °-fever or chills °-flushing, sweating, or hot feelings °-swelling of the ankles or feet °Side effects that usually do not require medical attention (report to your doctor or health care professional if they continue or are bothersome): °-diarrhea °-headache °-nausea, vomiting °-stomach pain °This list may not describe all possible side effects. Call your doctor for medical advice about side effects. You may report side effects to FDA at 1-800-FDA-1088. °Where should I keep my medicine? °This drug is given in a hospital or clinic and will not be stored at home. °NOTE: This sheet is a summary. It may not cover all possible information. If you have questions about this medicine, talk to your doctor, pharmacist, or health care provider. °© 2018 [employer] (2015-02-19 12:41:49) ° °

## 2016-05-16 ENCOUNTER — Encounter (HOSPITAL_COMMUNITY): Payer: Self-pay

## 2016-05-16 ENCOUNTER — Encounter (HOSPITAL_COMMUNITY)
Admission: RE | Admit: 2016-05-16 | Discharge: 2016-05-16 | Disposition: A | Discharge status: Home / Self Care | Payer: 59 | Source: Ambulatory Visit | Attending: Gastroenterology | Admitting: Gastroenterology

## 2016-05-16 DIAGNOSIS — D509 Iron deficiency anemia, unspecified: Secondary | ICD-10-CM | POA: Diagnosis not present

## 2016-05-16 MED ORDER — SODIUM CHLORIDE 0.9 % IV SOLN
510.0000 mg | INTRAVENOUS | Status: AC
  Administered 2016-05-16: 510 mg via INTRAVENOUS
  Filled 2016-05-16: qty 17

## 2016-05-16 MED ORDER — SODIUM CHLORIDE 0.9 % IV SOLN
INTRAVENOUS | Status: DC
  Administered 2016-05-16: 14:00:00 via INTRAVENOUS

## 2016-05-16 NOTE — Discharge Instructions (Signed)
Ferumoxytol injection/Feraheme Infusion °What is this medicine? °FERUMOXYTOL is an iron complex. Iron is used to make healthy red blood cells, which carry oxygen and nutrients throughout the body. This medicine is used to treat iron deficiency anemia in people with chronic kidney disease. °This medicine may be used for other purposes; ask your health care provider or pharmacist if you have questions. °COMMON BRAND NAME(S): Feraheme °What should I tell my health care provider before I take this medicine? °They need to know if you have any of these conditions: °-anemia not caused by low iron levels °-high levels of iron in the blood °-magnetic resonance imaging (MRI) test scheduled °-an unusual or allergic reaction to iron, other medicines, foods, dyes, or preservatives °-pregnant or trying to get pregnant °-breast-feeding °How should I use this medicine? °This medicine is for injection into a vein. It is given by a health care professional in a hospital or clinic setting. °Talk to your pediatrician regarding the use of this medicine in children. Special care may be needed. °Overdosage: If you think you have taken too much of this medicine contact a poison control center or emergency room at once. °NOTE: This medicine is only for you. Do not share this medicine with others. °What if I miss a dose? °It is important not to miss your dose. Call your doctor or health care professional if you are unable to keep an appointment. °What may interact with this medicine? °This medicine may interact with the following medications: °-other iron products °This list may not describe all possible interactions. Give your health care provider a list of all the medicines, herbs, non-prescription drugs, or dietary supplements you use. Also tell them if you smoke, drink alcohol, or use illegal drugs. Some items may interact with your medicine. °What should I watch for while using this medicine? °Visit your doctor or healthcare  professional regularly. Tell your doctor or healthcare professional if your symptoms do not start to get better or if they get worse. You may need blood work done while you are taking this medicine. °You may need to follow a special diet. Talk to your doctor. Foods that contain iron include: whole grains/cereals, dried fruits, beans, or peas, leafy green vegetables, and organ meats (liver, kidney). °What side effects may I notice from receiving this medicine? °Side effects that you should report to your doctor or health care professional as soon as possible: °-allergic reactions like skin rash, itching or hives, swelling of the face, lips, or tongue °-breathing problems °-changes in blood pressure °-feeling faint or lightheaded, falls °-fever or chills °-flushing, sweating, or hot feelings °-swelling of the ankles or feet °Side effects that usually do not require medical attention (report to your doctor or health care professional if they continue or are bothersome): °-diarrhea °-headache °-nausea, vomiting °-stomach pain °This list may not describe all possible side effects. Call your doctor for medical advice about side effects. You may report side effects to FDA at 1-800-FDA-1088. °Where should I keep my medicine? °This drug is given in a hospital or clinic and will not be stored at home. °NOTE: This sheet is a summary. It may not cover all possible information. If you have questions about this medicine, talk to your doctor, pharmacist, or health care provider. °© 2018 [employer] (2015-02-19 12:41:49) ° °

## 2016-05-16 NOTE — Progress Notes (Signed)
Pt tolerated Feraheme Infusion w/o/complications.

## 2016-05-30 ENCOUNTER — Encounter: Payer: Self-pay | Admitting: Gastroenterology

## 2016-06-10 ENCOUNTER — Encounter: Payer: 59 | Admitting: Gastroenterology

## 2016-06-20 ENCOUNTER — Encounter: Payer: Self-pay | Admitting: Gastroenterology

## 2016-06-20 ENCOUNTER — Ambulatory Visit (AMBULATORY_SURGERY_CENTER): Payer: 59 | Admitting: Gastroenterology

## 2016-06-20 VITALS — BP 115/65 | HR 68 | Temp 98.6°F | Resp 20 | Ht 64.0 in | Wt 151.0 lb

## 2016-06-20 DIAGNOSIS — R194 Change in bowel habit: Secondary | ICD-10-CM

## 2016-06-20 DIAGNOSIS — D508 Other iron deficiency anemias: Secondary | ICD-10-CM

## 2016-06-20 MED ORDER — SODIUM CHLORIDE 0.9 % IV SOLN: 500.0000 mL | INTRAVENOUS | Status: AC

## 2016-06-20 NOTE — Progress Notes (Signed)
Report given to PACU, vss 

## 2016-06-20 NOTE — Patient Instructions (Addendum)
Impression/Recommendations:  Repeat colonoscopy in 10 years for screening purposes.  Continue present medications including iron.    YOU HAD AN ENDOSCOPIC PROCEDURE TODAY AT [facility]:   Refer to the procedure report that was given to you for any specific questions about what was found during the examination.  If the procedure report does not answer your questions, please call your gastroenterologist to clarify.  If you requested that your care partner not be given the details of your procedure findings, then the procedure report has been included in a sealed envelope for you to review at your convenience later.  YOU SHOULD EXPECT: Some feelings of bloating in the abdomen. Passage of more gas than usual.  Walking can help get rid of the air that was put into your GI tract during the procedure and reduce the bloating. If you had a lower endoscopy (such as a colonoscopy or flexible sigmoidoscopy) you may notice spotting of blood in your stool or on the toilet paper. If you underwent a bowel prep for your procedure, you may not have a normal bowel movement for a few days.  Please Note:  You might notice some irritation and congestion in your nose or some drainage.  This is from the oxygen used during your procedure.  There is no need for concern and it should clear up in a day or so.  SYMPTOMS TO REPORT IMMEDIATELY:   Following lower endoscopy (colonoscopy or flexible sigmoidoscopy):  Excessive amounts of blood in the stool  Significant tenderness or worsening of abdominal pains  Swelling of the abdomen that is new, acute  Fever of 100F or higher  For urgent or emergent issues, a gastroenterologist can be reached at any hour by calling [phone].   DIET:  We do recommend a small meal at first, but then you may proceed to your regular diet.  Drink plenty of fluids but you should avoid alcoholic beverages for 24 hours.  ACTIVITY:  You should plan to take it easy for the  rest of today and you should NOT DRIVE or use heavy machinery until tomorrow (because of the sedation medicines used during the test).    FOLLOW UP: Our staff will call the number listed on your records the next business day following your procedure to check on you and address any questions or concerns that you may have regarding the information given to you following your procedure. If we do not reach you, we will leave a message.  However, if you are feeling well and you are not experiencing any problems, there is no need to return our call.  We will assume that you have returned to your regular daily activities without incident.  If any biopsies were taken you will be contacted by phone or by letter within the next 1-3 weeks.  Please call us at [phone] if you have not heard about the biopsies in 3 weeks.    SIGNATURES/CONFIDENTIALITY: You and/or your care partner have signed paperwork which will be entered into your electronic medical record.  These signatures attest to the fact that that the information above on your After Visit Summary has been reviewed and is understood.  Full responsibility of the confidentiality of this discharge information lies with you and/or your care-partner.

## 2016-06-20 NOTE — Op Note (Signed)
[facility] Patient Name: Valerie Edwards Procedure Date: 06/20/2016 11:14 AM MRN: [id number] Endoscopist: Ladene Artist , MD Age: 52 Referring MD:  Date of Birth: [birth year] Gender: Female Account #: [id number] Procedure:                Colonoscopy Indications:              Iron deficiency anemia, Change in bowel habits Medicines:                Monitored Anesthesia Care Procedure:                Pre-Anesthesia Assessment:                           - Prior to the procedure, a History and Physical                            was performed, and patient medications and                            allergies were reviewed. The patient's tolerance of                            previous anesthesia was also reviewed. The risks                            and benefits of the procedure and the sedation                            options and risks were discussed with the patient.                            All questions were answered, and informed consent                            was obtained. Prior Anticoagulants: The patient has                            taken no previous anticoagulant or antiplatelet                            agents. ASA Grade Assessment: II - A patient with                            mild systemic disease. After reviewing the risks                            and benefits, the patient was deemed in                            satisfactory condition to undergo the procedure.                           After obtaining informed consent, the colonoscope  was passed under direct vision. Throughout the                            procedure, the patient's blood pressure, pulse, and                            oxygen saturations were monitored continuously. The                            Model PCF-H190DL [id number]) scope was introduced                            through the anus and advanced to the the cecum,                            identified by  appendiceal orifice and ileocecal                            valve. The ileocecal valve, appendiceal orifice,                            and rectum were photographed. The quality of the                            bowel preparation was good. The patient tolerated                            the procedure well. The colonoscopy was somewhat                            difficult due to significant looping. Successful                            completion of the procedure was aided by using                            manual pressure, withdrawing and reinserting the                            scope and straightening and shortening the scope to                            obtain bowel loop reduction. Scope In: 1:24:59 PM Scope Out: 1:42:00 PM Scope Withdrawal Time: 0 hours 11 minutes 21 seconds  Total Procedure Duration: 0 hours 17 minutes 1 second  Findings:                 The perianal and digital rectal examinations were                            normal.                           The entire examined colon appeared normal on direct  and retroflexion views. Complications:            No immediate complications. Estimated blood loss:                            None. Estimated Blood Loss:     Estimated blood loss: none. Impression:               - The entire examined colon is normal on direct and                            retroflexion views.                           - No specimens collected. Recommendation:           - Repeat colonoscopy in 10 years for screening                            purposes.                           - Patient has a contact number available for                            emergencies. The signs and symptoms of potential                            delayed complications were discussed with the                            patient. Return to normal activities tomorrow.                            Written discharge instructions were provided to the                             patient.                           - Resume previous diet.                           - Continue present medications including iron.                           - Follow up with PCP for ongoing care. Ladene Artist, MD 06/20/2016 1:45:56 PM This report has been signed electronically.

## 2016-06-20 NOTE — Progress Notes (Signed)
Pt's states no medical or surgical changes since previsit or office visit. 

## 2016-06-21 ENCOUNTER — Telehealth: Payer: Self-pay | Admitting: *Deleted

## 2016-06-21 NOTE — Telephone Encounter (Signed)
  Follow up Call-  Call back number 06/20/2016  Post procedure Call Back phone  # [phone]  Permission to leave phone message Yes  Some recent data might be hidden     Patient questions:  Do you have a fever, pain , or abdominal swelling? No. Pain Score  0 *  Have you tolerated food without any problems? Yes.    Have you been able to return to your normal activities? Yes.    Do you have any questions about your discharge instructions: Diet   No. Medications  No. Follow up visit  No.  Do you have questions or concerns about your Care? No.  Actions: * If pain score is 4 or above: No action needed, pain <4.

## 2017-02-26 ENCOUNTER — Emergency Department (HOSPITAL_COMMUNITY)
Admission: EM | Admit: 2017-02-26 | Discharge: 2017-02-26 | Disposition: A | Discharge status: Home / Self Care | Payer: 59 | Attending: Emergency Medicine | Admitting: Emergency Medicine

## 2017-02-26 ENCOUNTER — Encounter (HOSPITAL_COMMUNITY): Payer: Self-pay

## 2017-02-26 ENCOUNTER — Emergency Department (HOSPITAL_COMMUNITY): Payer: 59

## 2017-02-26 DIAGNOSIS — J209 Acute bronchitis, unspecified: Secondary | ICD-10-CM | POA: Diagnosis not present

## 2017-02-26 DIAGNOSIS — J00 Acute nasopharyngitis [common cold]: Secondary | ICD-10-CM | POA: Insufficient documentation

## 2017-02-26 DIAGNOSIS — R05 Cough: Secondary | ICD-10-CM | POA: Diagnosis present

## 2017-02-26 DIAGNOSIS — Z79899 Other long term (current) drug therapy: Secondary | ICD-10-CM | POA: Insufficient documentation

## 2017-02-26 MED ORDER — BENZONATATE 200 MG PO CAPS: 200.0000 mg | ORAL_CAPSULE | Freq: Three times a day (TID) | ORAL | 0 refills | Status: DC | PRN

## 2017-02-26 MED ORDER — BENZONATATE 200 MG PO CAPS: 200.0000 mg | ORAL_CAPSULE | Freq: Three times a day (TID) | ORAL | 0 refills | Status: AC | PRN

## 2017-02-26 NOTE — Discharge Instructions (Signed)
You may take over-the-counter medicine for symptomatic relief, such as Tylenol, Motrin, TheraFlu, Alka seltzer , black elderberry, etc. Please limit acetaminophen (Tylenol) to 4000 mg and Ibuprofen (Motrin, Advil, etc.) to 2400 mg for a 24hr period. Please note that other over-the-counter medicine may contain acetaminophen or ibuprofen as a component of their ingredients.   

## 2017-02-26 NOTE — ED Provider Notes (Signed)
[facility] EMERGENCY DEPARTMENT Provider Note  CSN: [id number] Arrival date & time: 02/26/17 1254  Chief Complaint(s) Cough  HPI Valerie Edwards is a 52 y.o. female   The history is provided by the patient.  Cough  This is a new problem. Episode onset: 3 weeks. The problem occurs every few minutes. The problem has not changed since onset.The cough is productive of sputum. There has been no fever. Associated symptoms include chills, rhinorrhea, sore throat and myalgias. Pertinent negatives include no shortness of breath. Treatments tried: theraflu max capsules. The treatment provided mild relief. She is not a smoker. Her past medical history is significant for pneumonia. Her past medical history does not include COPD or asthma.   Sick contacts at home (grandchildren) with URI sx.  Past Medical History Past Medical History:  Diagnosis Date  . Anemia   . Fibroids   . Menorrhagia    Patient Active Problem List   Diagnosis Date Noted  . Fibroids 02/26/2013  . Menorrhagia 02/26/2013   Home Medication(s) Prior to Admission medications   Medication Sig Start Date End Date Taking? Authorizing Provider  benzonatate (TESSALON) 200 MG capsule Take 1 capsule (200 mg total) by mouth 3 (three) times daily as needed for up to 7 days for cough. 02/26/17 03/05/17  Fatima Blank, MD  ferrous sulfate 325 (65 FE) MG tablet Take 325 mg by mouth 2 (two) times daily with a meal.    [provider]  levothyroxine (SYNTHROID, LEVOTHROID) 175 MCG tablet Take 175 mcg by mouth daily before breakfast.    [provider]                                                                                                                                    Past Surgical History Past Surgical History:  Procedure Laterality Date  . UTERINE ARTERY EMBOLIZATION  02/26/2013   Family History Family History  Problem Relation Age of Onset  . Diabetes Sister   . Heart disease  Maternal Grandmother   . Colon cancer Neg Hx   . Stomach cancer Neg Hx     Social History Social History   Tobacco Use  . Smoking status: Never Smoker  . Smokeless tobacco: Never Used  Substance Use Topics  . Alcohol use: No  . Drug use: No   Allergies Patient has no known allergies.  Review of Systems Review of Systems  Constitutional: Positive for chills.  HENT: Positive for rhinorrhea and sore throat.   Respiratory: Positive for cough. Negative for shortness of breath.   Gastrointestinal: Negative for diarrhea, nausea and vomiting.  Musculoskeletal: Positive for myalgias.   All other systems are reviewed and are negative for acute change except as noted in the HPI  Physical Exam Vital Signs  I have reviewed the triage vital signs BP 110/69   Pulse 88   Temp 99.4 F (37.4 C) (Oral)  Resp 18   LMP 02/09/2017 (Exact Date)   SpO2 100%   Physical Exam  Constitutional: She is oriented to person, place, and time. She appears well-developed and well-nourished. No distress.  HENT:  Head: Normocephalic and atraumatic.  Nose: Mucosal edema and rhinorrhea present.  Mouth/Throat: Posterior oropharyngeal erythema (mild with postnasal drip and cobblestoning) present. No tonsillar exudate.  Eyes: Conjunctivae and EOM are normal. Pupils are equal, round, and reactive to light. Right eye exhibits no discharge. Left eye exhibits no discharge. No scleral icterus.  Neck: Normal range of motion. Neck supple.  Cardiovascular: Normal rate and regular rhythm. Exam reveals no gallop and no friction rub.  No murmur heard. Pulmonary/Chest: Effort normal and breath sounds normal. No stridor. No respiratory distress. She has no wheezes. She has no rhonchi. She has no rales.  Abdominal: Soft. She exhibits no distension. There is no tenderness.  Musculoskeletal: She exhibits no edema or tenderness.  Neurological: She is alert and oriented to person, place, and time.  Skin: Skin is warm and  dry. No rash noted. She is not diaphoretic. No erythema.  Psychiatric: She has a normal mood and affect.  Vitals reviewed.   ED Results and Treatments Labs (all labs ordered are listed, but only abnormal results are displayed) Labs Reviewed - No data to display                                                                                                                       EKG  EKG Interpretation  Date/Time:    Ventricular Rate:    PR Interval:    QRS Duration:   QT Interval:    QTC Calculation:   R Axis:     Text Interpretation:        Radiology Dg Chest 2 View  Result Date: 02/26/2017 CLINICAL DATA:  Flu like symptoms for 3 weeks, productive cough, shortness of breath, low-grade fever, nausea EXAM: CHEST  2 VIEW COMPARISON:  05/07/2015 FINDINGS: Normal heart size, mediastinal contours, and pulmonary vascularity. Lungs clear. No pleural effusion or pneumothorax. Bones unremarkable. IMPRESSION: Normal exam. Electronically Signed   By: Lavonia Dana M.D.   On: 02/26/2017 13:59   Pertinent labs & imaging results that were available during my care of the patient were reviewed by me and considered in my medical decision making (see chart for details).  Medications Ordered in ED Medications - No data to display  Procedures Procedures  (including critical care time)  Medical Decision Making / ED Course I have reviewed the nursing notes for this encounter and the patient's prior records (if available in EHR or on provided paperwork).    52 y.o. female presents with cough, rhinorrhea, and nasal congestion for 3 weeks days. adequate oral hydration. Rest of history as above.  Patient appears well. No signs of toxicity, patient is interactive and playful. No hypoxia, tachypnea or other signs of respiratory distress. No sign of clinical dehydration. Lung  exam clear. Rest of exam as above.  Most consistent with viral respiratory infection.   CXR w/o PNA  No evidence suggestive of pharyngitis, AOM, PNA.    Discussed symptomatic treatment with the patient and they will follow closely with their PCP.   The patient is safe for discharge with strict return precautions.    Final Clinical Impression(s) / ED Diagnoses Final diagnoses:  Acute bronchitis, unspecified organism  Acute nasopharyngitis   Disposition: Discharge  Condition: Good  I have discussed the results, Dx and Tx plan with the patient who expressed understanding and agree(s) with the plan. Discharge instructions discussed at great length. The patient was given strict return precautions who verbalized understanding of the instructions. No further questions at time of discharge.    ED Discharge Orders        Ordered    benzonatate (TESSALON) 200 MG capsule  3 times daily PRN,   Status:  Discontinued     02/26/17 1501    benzonatate (TESSALON) 200 MG capsule  3 times daily PRN     02/26/17 1503       Follow Up: Primary care provider  Schedule an appointment as soon as possible for a visit  in 1-2 week, If symptoms do not improve or  worsen     This chart was dictated using voice recognition software.  Despite best efforts to proofread,  errors can occur which can change the documentation meaning.   Fatima Blank, MD 02/26/17 [phone]

## 2017-02-26 NOTE — ED Notes (Signed)
Pt stable, ambulatory, states understanding of discharge instructions 

## 2017-02-26 NOTE — ED Triage Notes (Signed)
Patient complains of fatigue with cough x 3 weeks and now generalized body aches. Alert and oriented, NAD

## 2017-10-11 ENCOUNTER — Other Ambulatory Visit: Payer: Self-pay

## 2017-10-11 ENCOUNTER — Emergency Department (HOSPITAL_COMMUNITY)
Admission: EM | Admit: 2017-10-11 | Discharge: 2017-10-11 | Disposition: A | Discharge status: Home / Self Care | Payer: 59 | Attending: Emergency Medicine | Admitting: Emergency Medicine

## 2017-10-11 ENCOUNTER — Encounter (HOSPITAL_COMMUNITY): Payer: Self-pay | Admitting: Obstetrics and Gynecology

## 2017-10-11 DIAGNOSIS — R51 Headache: Secondary | ICD-10-CM | POA: Diagnosis not present

## 2017-10-11 DIAGNOSIS — Z79899 Other long term (current) drug therapy: Secondary | ICD-10-CM | POA: Diagnosis not present

## 2017-10-11 DIAGNOSIS — R519 Headache, unspecified: Secondary | ICD-10-CM

## 2017-10-11 MED ORDER — CYCLOBENZAPRINE HCL 5 MG PO TABS: 10.0000 mg | ORAL_TABLET | Freq: Three times a day (TID) | ORAL | 0 refills | Status: DC | PRN

## 2017-10-11 NOTE — Discharge Instructions (Addendum)
Your headache is likely related to muscle contraction.  Try using heat on the sore area 3 or 4 times a day.  We also recommend taking ibuprofen, 400 mg, 3 times a day with meals for pain.  Continue this for 4 or 5 days.  We are prescribing a muscle relaxer to take to help relax the muscles of your neck and upper back.  Do not drive or work when you are taking this medication.  Follow-up with your primary care doctor if not better in 3 or 4 days.  Return here, if needed, for problems.

## 2017-10-11 NOTE — ED Provider Notes (Signed)
[place] DEPT Provider Note   CSN: [id number] Arrival date & time: 10/11/17  1657     History   Chief Complaint Chief Complaint  Patient presents with  . Migraine    HPI RAI SINAGRA is a 52 y.o. female.  HPI   Presents for evaluation of headache present for 5 days which she describes as "a migraine."  She has been using TheraFlu with partial relief.  She denies stress, fever, prior arthritis, nausea, vomiting, weakness or dizziness.  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  No recent trauma.  There are no other no modifying factors.  Past Medical History:  Diagnosis Date  . Anemia   . Fibroids   . Menorrhagia     Patient Active Problem List   Diagnosis Date Noted  . Fibroids 02/26/2013  . Menorrhagia 02/26/2013    Past Surgical History:  Procedure Laterality Date  . UTERINE ARTERY EMBOLIZATION  02/26/2013     OB History   None      Home Medications    Prior to Admission medications   Medication Sig Start Date End Date Taking? Authorizing Provider  levothyroxine (SYNTHROID, LEVOTHROID) 175 MCG tablet Take 175 mcg by mouth daily before breakfast.   Yes [provider]  cyclobenzaprine (FLEXERIL) 5 MG tablet Take 2 tablets (10 mg total) by mouth 3 (three) times daily as needed for muscle spasms. 10/11/17   Daleen Bo, MD    Family History Family History  Problem Relation Age of Onset  . Diabetes Sister   . Heart disease Maternal Grandmother   . Colon cancer Neg Hx   . Stomach cancer Neg Hx     Social History Social History   Tobacco Use  . Smoking status: Never Smoker  . Smokeless tobacco: Never Used  Substance Use Topics  . Alcohol use: No  . Drug use: No     Allergies   Patient has no known allergies.   Review of Systems Review of Systems  All other systems reviewed and are negative.    Physical Exam Updated Vital Signs BP 128/81   Pulse 92   Temp 98.6 F  (37 C)   Resp 15   SpO2 96%   Physical Exam  Constitutional: She is oriented to person, place, and time. She appears well-developed and well-nourished.  HENT:  Head: Normocephalic and atraumatic.  Eyes: Pupils are equal, round, and reactive to light. Conjunctivae and EOM are normal.  Neck: Normal range of motion and phonation normal. Neck supple.  Cardiovascular: Normal rate and regular rhythm.  Pulmonary/Chest: Effort normal and breath sounds normal. She exhibits no tenderness.  Abdominal: Soft. She exhibits no distension. There is no tenderness. There is no guarding.  Musculoskeletal: Normal range of motion.  Mild tenderness left lower cervical musculature.  Normal range of motion neck and back.  Normal gait.  Neurological: She is alert and oriented to person, place, and time. She exhibits normal muscle tone.  No dysarthria or aphasia.  Skin: Skin is warm and dry.  Psychiatric: She has a normal mood and affect. Her behavior is normal. Judgment and thought content normal.  Nursing note and vitals reviewed.    ED Treatments / Results  Labs (all labs ordered are listed, but only abnormal results are displayed) Labs Reviewed - No data to display  EKG None  Radiology No results found.  Procedures Procedures (including critical care time)  Medications Ordered in  ED Medications - No data to display   Initial Impression / Assessment and Plan / ED Course  I have reviewed the triage vital signs and the nursing notes.  Pertinent labs & imaging results that were available during my care of the patient were reviewed by me and considered in my medical decision making (see chart for details).      Patient Vitals for the past 24 hrs:  BP Temp Pulse Resp SpO2  10/11/17 1701 128/81 98.6 F (37 C) 92 15 96 %    6:52 PM Reevaluation with update and discussion. After initial assessment and treatment, an updated evaluation reveals she remains comfortable and has no further  complaints.  Findings discussed and questions answered. Daleen Bo   Medical Decision Making: Specific cephalization, likely muscle contraction headache.  Doubt migraine, intracranial lesion, intercranial bleeding, meningitis or cervical myelopathy.  CRITICAL CARE-no Performed by: Daleen Bo   Nursing Notes Reviewed/ Care Coordinated Applicable Imaging Reviewed Interpretation of Laboratory Data incorporated into ED treatment  The patient appears reasonably screened and/or stabilized for discharge and I doubt any other medical condition or other [facility] requiring further screening, evaluation, or treatment in the ED at this time prior to discharge.  Plan: Home Medications-continue usual medications; Home Treatments-rest, heat; return here if the recommended treatment, does not improve the symptoms; Recommended follow up-PCP, PRN     Final Clinical Impressions(s) / ED Diagnoses   Final diagnoses:  Nonintractable headache, unspecified chronicity pattern, unspecified headache type    ED Discharge Orders         Ordered    cyclobenzaprine (FLEXERIL) 5 MG tablet  3 times daily PRN     10/11/17 1853           Daleen Bo, MD 10/11/17 1856

## 2017-10-11 NOTE — ED Triage Notes (Signed)
Pt reports to the ED with c/o migraine x5 days. Pt reports no photosensitivity but is sensitive to sound. Pt reports nausea but no emesis.  Pt denies any other complaint

## 2018-06-19 ENCOUNTER — Emergency Department (HOSPITAL_COMMUNITY): Payer: No Typology Code available for payment source

## 2018-06-19 ENCOUNTER — Emergency Department (HOSPITAL_COMMUNITY)
Admission: EM | Admit: 2018-06-19 | Discharge: 2018-06-20 | Disposition: A | Discharge status: Home / Self Care | Payer: No Typology Code available for payment source | Attending: Emergency Medicine | Admitting: Emergency Medicine

## 2018-06-19 ENCOUNTER — Encounter (HOSPITAL_COMMUNITY): Payer: Self-pay

## 2018-06-19 ENCOUNTER — Other Ambulatory Visit: Payer: Self-pay

## 2018-06-19 DIAGNOSIS — D259 Leiomyoma of uterus, unspecified: Secondary | ICD-10-CM | POA: Insufficient documentation

## 2018-06-19 DIAGNOSIS — S161XXA Strain of muscle, fascia and tendon at neck level, initial encounter: Secondary | ICD-10-CM | POA: Diagnosis not present

## 2018-06-19 DIAGNOSIS — N949 Unspecified condition associated with female genital organs and menstrual cycle: Secondary | ICD-10-CM

## 2018-06-19 DIAGNOSIS — S060X0A Concussion without loss of consciousness, initial encounter: Secondary | ICD-10-CM

## 2018-06-19 DIAGNOSIS — Y92410 Unspecified street and highway as the place of occurrence of the external cause: Secondary | ICD-10-CM | POA: Insufficient documentation

## 2018-06-19 DIAGNOSIS — Z79899 Other long term (current) drug therapy: Secondary | ICD-10-CM | POA: Insufficient documentation

## 2018-06-19 DIAGNOSIS — Y9389 Activity, other specified: Secondary | ICD-10-CM | POA: Diagnosis not present

## 2018-06-19 DIAGNOSIS — S0990XA Unspecified injury of head, initial encounter: Secondary | ICD-10-CM | POA: Diagnosis present

## 2018-06-19 DIAGNOSIS — Y998 Other external cause status: Secondary | ICD-10-CM | POA: Insufficient documentation

## 2018-06-19 DIAGNOSIS — M79632 Pain in left forearm: Secondary | ICD-10-CM | POA: Insufficient documentation

## 2018-06-19 DIAGNOSIS — S52592A Other fractures of lower end of left radius, initial encounter for closed fracture: Secondary | ICD-10-CM | POA: Diagnosis not present

## 2018-06-19 DIAGNOSIS — M25532 Pain in left wrist: Secondary | ICD-10-CM | POA: Diagnosis not present

## 2018-06-19 DIAGNOSIS — S3991XA Unspecified injury of abdomen, initial encounter: Secondary | ICD-10-CM | POA: Insufficient documentation

## 2018-06-19 DIAGNOSIS — N838 Other noninflammatory disorders of ovary, fallopian tube and broad ligament: Secondary | ICD-10-CM | POA: Diagnosis not present

## 2018-06-19 LAB — CBC WITH DIFFERENTIAL/PLATELET
Abs Immature Granulocytes: 0.03 10*3/uL (ref 0.00–0.07)
Basophils Absolute: 0 10*3/uL (ref 0.0–0.1)
Basophils Relative: 0 %
Eosinophils Absolute: 0 10*3/uL (ref 0.0–0.5)
Eosinophils Relative: 0 %
HCT: 37.9 % (ref 36.0–46.0)
Hemoglobin: 12.2 g/dL (ref 12.0–15.0)
Immature Granulocytes: 0 %
Lymphocytes Relative: 20 %
Lymphs Abs: 1.9 10*3/uL (ref 0.7–4.0)
MCH: 26.8 pg (ref 26.0–34.0)
MCHC: 32.2 g/dL (ref 30.0–36.0)
MCV: 83.1 fL (ref 80.0–100.0)
Monocytes Absolute: 0.6 10*3/uL (ref 0.1–1.0)
Monocytes Relative: 7 %
Neutro Abs: 6.8 10*3/uL (ref 1.7–7.7)
Neutrophils Relative %: 73 %
Platelets: 332 10*3/uL (ref 150–400)
RBC: 4.56 MIL/uL (ref 3.87–5.11)
RDW: 14.9 % (ref 11.5–15.5)
WBC: 9.4 10*3/uL (ref 4.0–10.5)
nRBC: 0 % (ref 0.0–0.2)

## 2018-06-19 MED ORDER — SODIUM CHLORIDE 0.9 % IV BOLUS
1000.0000 mL | Freq: Once | INTRAVENOUS | Status: AC
  Administered 2018-06-19: 23:00:00 1000 mL via INTRAVENOUS

## 2018-06-19 MED ORDER — MORPHINE SULFATE (PF) 4 MG/ML IV SOLN
4.0000 mg | Freq: Once | INTRAVENOUS | Status: AC
  Administered 2018-06-19: 4 mg via INTRAVENOUS
  Filled 2018-06-19: qty 1

## 2018-06-19 NOTE — ED Notes (Signed)
Pts daughter stormed into the waiting room saying that her mother "needs pain medication right away, she's in pain and has been waiting a long time."
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
## 2018-06-19 NOTE — ED Provider Notes (Signed)
Plan to f/u on imaging Will need splint Pt stable at this time   Ripley Fraise, MD 06/19/18 2316

## 2018-06-20 ENCOUNTER — Emergency Department (HOSPITAL_COMMUNITY): Payer: No Typology Code available for payment source

## 2018-06-20 DIAGNOSIS — S060X0A Concussion without loss of consciousness, initial encounter: Secondary | ICD-10-CM | POA: Diagnosis not present

## 2018-06-20 LAB — COMPREHENSIVE METABOLIC PANEL
ALT: 16 U/L (ref 0–44)
AST: 30 U/L (ref 15–41)
Albumin: 3.7 g/dL (ref 3.5–5.0)
Alkaline Phosphatase: 69 U/L (ref 38–126)
Anion gap: 8 (ref 5–15)
BUN: 7 mg/dL (ref 6–20)
CO2: 23 mmol/L (ref 22–32)
Calcium: 9.1 mg/dL (ref 8.9–10.3)
Chloride: 105 mmol/L (ref 98–111)
Creatinine, Ser: 0.5 mg/dL (ref 0.44–1.00)
GFR calc Af Amer: >60 mL/min (ref >60)
GFR calc non Af Amer: >60 mL/min (ref >60)
Glucose, Bld: 99 mg/dL (ref 70–99)
Potassium: 4.4 mmol/L (ref 3.5–5.1)
Sodium: 136 mmol/L (ref 135–145)
Total Bilirubin: 0.5 mg/dL (ref 0.3–1.2)
Total Protein: 7.8 g/dL (ref 6.5–8.1)

## 2018-06-20 MED ORDER — HYDROCODONE-ACETAMINOPHEN 5-325 MG PO TABS
1.0000 | ORAL_TABLET | Freq: Once | ORAL | Status: AC
  Administered 2018-06-20: 1 via ORAL
  Filled 2018-06-20: qty 1

## 2018-06-20 MED ORDER — HYDROCODONE-ACETAMINOPHEN 5-325 MG PO TABS: 1.0000 | ORAL_TABLET | Freq: Four times a day (QID) | ORAL | 0 refills | Status: AC | PRN

## 2018-06-20 MED ORDER — IOHEXOL 300 MG/ML  SOLN
100.0000 mL | Freq: Once | INTRAMUSCULAR | Status: AC | PRN
  Administered 2018-06-20: 100 mL via INTRAVENOUS

## 2018-06-20 NOTE — ED Provider Notes (Signed)
Aside from left wrist fracture, no other signs of acute traumatic injury.  She had incidental findings on CT imaging including fibroid uterus and adnexal cyst.  She was advised of these findings and advised to follow with her OB/GYN for an outpatient ultrasound.  She has been referred to hand surgery later today for wrist fx   She is in a splint and is tolerating well  SPLINT APPLICATION Date/Time: 3437DH  Authorized by: Sharyon Cable Consent: Verbal consent obtained. Risks and benefits: risks, benefits and alternatives were discussed Consent given by: patient Splint applied by: orthopedic technician Location details: left wrist Splint type: sugartong Supplies used: ortho glass Post-procedure: The splinted body part was neurovascularly unchanged following the procedure. Patient tolerance: Patient tolerated the procedure well with no immediate complications.      Ripley Fraise, MD 06/20/18 [phone]

## 2018-06-20 NOTE — Progress Notes (Signed)
Orthopedic Tech Progress Note Patient Details:  Valerie Edwards 02-Sep-1965 [id number]  Ortho Devices Type of Ortho Device: Sugartong splint Ortho Device/Splint Interventions: Adjustment   Post Interventions Patient Tolerated: Well Instructions Provided: Adjustment of device, Care of device   Melony Overly T 06/20/2018, 1:26 AM

## 2018-06-20 NOTE — ED Notes (Signed)
Patient verbalizes understanding of discharge instructions. Opportunity for questioning and answers were provided. Armband removed by staff, pt discharged from ED by wheelchair with daughter to transport pt home.

## 2018-06-20 NOTE — ED Notes (Signed)
Pt transported to CT ?

## 2018-06-20 NOTE — Discharge Instructions (Signed)
Please call Dr. Amedeo Plenty today for follow-up with your wrist fracture  For the ovarian cyst and the fibroid uterus, please follow-up with your OB/GYN in the next month for an ultrasound

## 2018-06-28 ENCOUNTER — Encounter (HOSPITAL_COMMUNITY): Payer: Self-pay | Admitting: Vascular Surgery
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
## 2018-06-29 ENCOUNTER — Other Ambulatory Visit (HOSPITAL_COMMUNITY)
Admission: RE | Admit: 2018-06-29 | Discharge: 2018-06-29 | Disposition: A | Discharge status: Home / Self Care | Payer: No Typology Code available for payment source | Source: Ambulatory Visit | Attending: Orthopedic Surgery | Admitting: Orthopedic Surgery

## 2018-06-29 ENCOUNTER — Encounter (HOSPITAL_COMMUNITY): Payer: Self-pay | Admitting: *Deleted

## 2018-06-29 ENCOUNTER — Other Ambulatory Visit: Payer: Self-pay

## 2018-06-29 DIAGNOSIS — Z1159 Encounter for screening for other viral diseases: Secondary | ICD-10-CM | POA: Diagnosis not present

## 2018-06-29 LAB — SARS CORONAVIRUS 2 BY RT PCR (HOSPITAL ORDER, PERFORMED IN ~~LOC~~ HOSPITAL LAB): SARS Coronavirus 2: NEGATIVE

## 2018-06-29 NOTE — Anesthesia Preprocedure Evaluation (Addendum)
Anesthesia Evaluation  Patient identified by MRN, date of birth, ID band Patient awake    Reviewed: Allergy & Precautions, NPO status , Patient's Chart, lab work & pertinent test results  Airway Mallampati: II  TM Distance: >3 FB Neck ROM: Full    Dental  (+) Dental Advisory Given   Pulmonary neg pulmonary ROS,    Pulmonary exam normal breath sounds clear to auscultation       Cardiovascular negative cardio ROS Normal cardiovascular exam Rhythm:Regular Rate:Normal  SUMMARY - The left ventricle was mildly dilated. Overall left ventricular systolic function was mildly decreased. Left ventricular ejection fraction was estimated , range being 45 % to 50 %. There was diffuse left ventricular hypokinesis with regional variations, but without diagnostic evidence for regional wall motion abnormalities. - The aortic valve was mildly calcified. - Left atrial size was at the upper limits of normal. - Small primarily posterior pericardial effusion with no tamponade and flat IVC  IMPRESSIONS - There was no echocardiographic evidence for a cardiac source of embolism.   Neuro/Psych negative neurological ROS  negative psych ROS   GI/Hepatic negative GI ROS, Neg liver ROS,   Endo/Other  Hypothyroidism   Renal/GU negative Renal ROS     Musculoskeletal negative musculoskeletal ROS (+)   Abdominal   Peds  Hematology  (+) Blood dyscrasia, anemia ,   Anesthesia Other Findings   Reproductive/Obstetrics negative OB ROS                            Anesthesia Physical Anesthesia Plan  ASA: II  Anesthesia Plan: Regional   Post-op Pain Management:    Induction: Intravenous  PONV Risk Score and Plan: Ondansetron, Dexamethasone, Propofol infusion, Midazolam and Treatment may vary due to age or medical condition  Airway Management Planned: Simple Face Mask  Additional Equipment:   Intra-op Plan:    Post-operative Plan:   Informed Consent: I have reviewed the patients History and Physical, chart, labs and discussed the procedure including the risks, benefits and alternatives for the proposed anesthesia with the patient or authorized representative who has indicated his/her understanding and acceptance.     Dental advisory given  Plan Discussed with: CRNA  Anesthesia Plan Comments: (PAT note written 06/29/2018 by Myra Gianotti, PA-C. )      Anesthesia Quick Evaluation

## 2018-06-29 NOTE — Progress Notes (Signed)
Pt denies SOB, chest pain, and being under the care of a cardiologist. Pt denies having a stress test and cardiac cath. Pt stated that she never followed up with a cardiologist after being discharged from the hospital. Pt denies having an EKG within the last year. Pt made aware to stop taking Aspirin (unless otherwise advised by surgeon), vitamins, fish oil and herbal medications. Do not take any NSAIDs ie: Ibuprofen, Advil, Naproxen (Aleve), Motrin, BC and Goody Powder.  Pt stated that she is currently taking synthroid 175 MCG that was prescribed by Dr. Precious Haws.  Pt denies that she and family members tested positive for COVID-19 ( pt needs COVID-19 test on DOS; reminded to quarantine).   Pt denies that she and family members experienced the following symptoms:  Cough yes/no: No Fever (>100.36F)  yes/no: No Runny nose yes/no: No Sore throat yes/no: No Difficulty breathing/shortness of breath  yes/no: No  Have you or a family member traveled in the last 14 days and where? yes/no: No  Pt reminded that hospital visitation restrictions are in effect and the importance of the restrictions.   Pt verbalized understanding of all pre-op instructions.   PA,  Anesthesiology, asked to review pt history; see note.

## 2018-06-30 ENCOUNTER — Encounter (HOSPITAL_COMMUNITY)
Admission: RE | Disposition: A | Discharge status: Home / Self Care | Payer: Self-pay | Source: Home / Self Care | Attending: Orthopedic Surgery

## 2018-06-30 ENCOUNTER — Encounter (HOSPITAL_COMMUNITY): Payer: Self-pay | Admitting: Certified Registered Nurse Anesthetist

## 2018-06-30 ENCOUNTER — Ambulatory Visit (HOSPITAL_COMMUNITY): Payer: No Typology Code available for payment source | Admitting: Vascular Surgery

## 2018-06-30 ENCOUNTER — Ambulatory Visit (HOSPITAL_COMMUNITY)
Admission: RE | Admit: 2018-06-30 | Discharge: 2018-06-30 | Disposition: A | Discharge status: Home / Self Care | Payer: No Typology Code available for payment source | Attending: Orthopedic Surgery | Admitting: Orthopedic Surgery

## 2018-06-30 DIAGNOSIS — Z79899 Other long term (current) drug therapy: Secondary | ICD-10-CM | POA: Insufficient documentation

## 2018-06-30 DIAGNOSIS — D649 Anemia, unspecified: Secondary | ICD-10-CM | POA: Diagnosis not present

## 2018-06-30 DIAGNOSIS — S52572A Other intraarticular fracture of lower end of left radius, initial encounter for closed fracture: Secondary | ICD-10-CM | POA: Insufficient documentation

## 2018-06-30 DIAGNOSIS — E039 Hypothyroidism, unspecified: Secondary | ICD-10-CM | POA: Diagnosis not present

## 2018-06-30 DIAGNOSIS — Z79890 Hormone replacement therapy (postmenopausal): Secondary | ICD-10-CM | POA: Insufficient documentation

## 2018-06-30 HISTORY — DX: Other injury of unspecified body region, initial encounter: T14.8XXA

## 2018-06-30 HISTORY — DX: Hypothyroidism, unspecified: E03.9

## 2018-06-30 HISTORY — PX: ORIF WRIST FRACTURE: SHX2133

## 2018-06-30 HISTORY — DX: Pneumonia, unspecified organism: J18.9

## 2018-06-30 LAB — POCT PREGNANCY, URINE: Preg Test, Ur: NEGATIVE

## 2018-06-30 SURGERY — OPEN REDUCTION INTERNAL FIXATION (ORIF) WRIST FRACTURE
Anesthesia: Regional | Site: Arm Lower | Laterality: Left

## 2018-06-30 MED ORDER — EPHEDRINE SULFATE-NACL 50-0.9 MG/10ML-% IV SOSY
PREFILLED_SYRINGE | INTRAVENOUS | Status: DC | PRN
  Administered 2018-06-30 (×2): 5 mg via INTRAVENOUS

## 2018-06-30 MED ORDER — MEPERIDINE HCL 25 MG/ML IJ SOLN: 6.2500 mg | INTRAMUSCULAR | Status: DC | PRN

## 2018-06-30 MED ORDER — 0.9 % SODIUM CHLORIDE (POUR BTL) OPTIME
TOPICAL | Status: DC | PRN
  Administered 2018-06-30: 1000 mL

## 2018-06-30 MED ORDER — FENTANYL CITRATE (PF) 250 MCG/5ML IJ SOLN
INTRAMUSCULAR | Status: AC
  Filled 2018-06-30: qty 5

## 2018-06-30 MED ORDER — PROMETHAZINE HCL 25 MG/ML IJ SOLN: 6.2500 mg | INTRAMUSCULAR | Status: DC | PRN

## 2018-06-30 MED ORDER — MIDAZOLAM HCL 2 MG/2ML IJ SOLN
INTRAMUSCULAR | Status: AC
  Filled 2018-06-30: qty 2

## 2018-06-30 MED ORDER — ROPIVACAINE HCL 7.5 MG/ML IJ SOLN
INTRAMUSCULAR | Status: DC | PRN
  Administered 2018-06-30: 40 mL via PERINEURAL

## 2018-06-30 MED ORDER — PROPOFOL 10 MG/ML IV BOLUS
INTRAVENOUS | Status: AC
  Filled 2018-06-30: qty 40

## 2018-06-30 MED ORDER — PROPOFOL 500 MG/50ML IV EMUL
INTRAVENOUS | Status: DC | PRN
  Administered 2018-06-30: 75 ug/kg/min via INTRAVENOUS

## 2018-06-30 MED ORDER — MIDAZOLAM HCL 2 MG/2ML IJ SOLN
INTRAMUSCULAR | Status: DC | PRN
  Administered 2018-06-30: 2 mg via INTRAVENOUS

## 2018-06-30 MED ORDER — FENTANYL CITRATE (PF) 100 MCG/2ML IJ SOLN
INTRAMUSCULAR | Status: DC | PRN
  Administered 2018-06-30 (×2): 50 ug via INTRAVENOUS

## 2018-06-30 MED ORDER — CHLORHEXIDINE GLUCONATE 4 % EX LIQD: 60.0000 mL | Freq: Once | CUTANEOUS | Status: DC

## 2018-06-30 MED ORDER — LACTATED RINGERS IV SOLN
INTRAVENOUS | Status: DC
  Administered 2018-06-30: 07:00:00 via INTRAVENOUS

## 2018-06-30 MED ORDER — DEXAMETHASONE SODIUM PHOSPHATE 10 MG/ML IJ SOLN
INTRAMUSCULAR | Status: DC | PRN
  Administered 2018-06-30: 4 mg via INTRAVENOUS

## 2018-06-30 MED ORDER — FENTANYL CITRATE (PF) 100 MCG/2ML IJ SOLN: 25.0000 ug | INTRAMUSCULAR | Status: DC | PRN

## 2018-06-30 MED ORDER — CEFAZOLIN SODIUM-DEXTROSE 2-4 GM/100ML-% IV SOLN
2.0000 g | INTRAVENOUS | Status: AC
  Administered 2018-06-30: 2 g via INTRAVENOUS

## 2018-06-30 MED ORDER — ONDANSETRON HCL 4 MG/2ML IJ SOLN
INTRAMUSCULAR | Status: DC | PRN
  Administered 2018-06-30: 4 mg via INTRAVENOUS

## 2018-06-30 SURGICAL SUPPLY — 66 items
BANDAGE ACE 3X5.8 VEL STRL LF (GAUZE/BANDAGES/DRESSINGS) ×3 IMPLANT
BANDAGE ACE 4X5 VEL STRL LF (GAUZE/BANDAGES/DRESSINGS) ×3 IMPLANT
BANDAGE ELASTIC 3 VELCRO ST LF (GAUZE/BANDAGES/DRESSINGS) ×2 IMPLANT
BANDAGE ELASTIC 4 VELCRO ST LF (GAUZE/BANDAGES/DRESSINGS) ×3 IMPLANT
BIT DRILL 2.2 SS TIBIAL (BIT) ×3 IMPLANT
BLADE CLIPPER SURG (BLADE) IMPLANT
BNDG ESMARK 4X9 LF (GAUZE/BANDAGES/DRESSINGS) ×3 IMPLANT
BNDG GAUZE ELAST 4 BULKY (GAUZE/BANDAGES/DRESSINGS) ×3 IMPLANT
CANISTER SUCT 3000ML PPV (MISCELLANEOUS) ×3 IMPLANT
CORDS BIPOLAR (ELECTRODE) ×3 IMPLANT
COVER SURGICAL LIGHT HANDLE (MISCELLANEOUS) ×3 IMPLANT
COVER WAND RF STERILE (DRAPES) ×3 IMPLANT
CUFF TOURNIQUET SINGLE 18IN (TOURNIQUET CUFF) ×3 IMPLANT
CUFF TOURNIQUET SINGLE 24IN (TOURNIQUET CUFF) IMPLANT
DRAIN TLS ROUND 10FR (DRAIN) IMPLANT
DRAPE OEC MINIVIEW 54X84 (DRAPES) ×3 IMPLANT
DRAPE SURG 17X23 STRL (DRAPES) ×3 IMPLANT
DRSG ADAPTIC 3X8 NADH LF (GAUZE/BANDAGES/DRESSINGS) ×3 IMPLANT
DRSG XEROFORM 1X8 (GAUZE/BANDAGES/DRESSINGS) ×3 IMPLANT
GAUZE SPONGE 4X4 12PLY STRL (GAUZE/BANDAGES/DRESSINGS) ×3 IMPLANT
GAUZE XEROFORM 1X8 LF (GAUZE/BANDAGES/DRESSINGS) ×3 IMPLANT
GLOVE BIOGEL M 8.0 STRL (GLOVE) ×3 IMPLANT
GLOVE SS BIOGEL STRL SZ 8 (GLOVE) ×1 IMPLANT
GLOVE SUPERSENSE BIOGEL SZ 8 (GLOVE) ×2
GOWN STRL REUS W/ TWL LRG LVL3 (GOWN DISPOSABLE) ×1 IMPLANT
GOWN STRL REUS W/ TWL XL LVL3 (GOWN DISPOSABLE) ×2 IMPLANT
GOWN STRL REUS W/TWL LRG LVL3 (GOWN DISPOSABLE) ×2
GOWN STRL REUS W/TWL XL LVL3 (GOWN DISPOSABLE) ×4
K-WIRE 1.6 (WIRE) ×2
K-WIRE FX5X1.6XNS BN SS (WIRE) ×1
KIT BASIN OR (CUSTOM PROCEDURE TRAY) ×3 IMPLANT
KIT TURNOVER KIT B (KITS) ×3 IMPLANT
KWIRE FX5X1.6XNS BN SS (WIRE) ×1 IMPLANT
LOOP VESSEL MAXI BLUE (MISCELLANEOUS) IMPLANT
MANIFOLD NEPTUNE II (INSTRUMENTS) ×3 IMPLANT
NEEDLE 22X1 1/2 (OR ONLY) (NEEDLE) IMPLANT
NS IRRIG 1000ML POUR BTL (IV SOLUTION) ×3 IMPLANT
PACK ORTHO EXTREMITY (CUSTOM PROCEDURE TRAY) ×3 IMPLANT
PAD ARMBOARD 7.5X6 YLW CONV (MISCELLANEOUS) ×6 IMPLANT
PAD CAST 3X4 CTTN HI CHSV (CAST SUPPLIES) ×1 IMPLANT
PAD CAST 4YDX4 CTTN HI CHSV (CAST SUPPLIES) ×1 IMPLANT
PADDING CAST ABS 3INX4YD NS (CAST SUPPLIES) ×2
PADDING CAST ABS COTTON 3X4 (CAST SUPPLIES) ×1 IMPLANT
PADDING CAST COTTON 3X4 STRL (CAST SUPPLIES) ×2
PADDING CAST COTTON 4X4 STRL (CAST SUPPLIES) ×2
PEG LOCKING SMOOTH 2.2X20 (Screw) ×18 IMPLANT
PLATE CROSSLOCK NAR MINI RT (Plate) ×3 IMPLANT
SCREW LOCK 12X2.7X 3 LD (Screw) ×1 IMPLANT
SCREW LOCKING 2.7X12MM (Screw) ×2 IMPLANT
SCREW LOCKING 2.7X13MM (Screw) ×9 IMPLANT
SCRUB BETADINE 4OZ XXX (MISCELLANEOUS) ×3 IMPLANT
SOL PREP POV-IOD 4OZ 10% (MISCELLANEOUS) ×3 IMPLANT
SPLINT FIBERGLASS 3X12 (CAST SUPPLIES) ×3 IMPLANT
SPONGE LAP 4X18 RFD (DISPOSABLE) IMPLANT
SUT MNCRL AB 4-0 PS2 18 (SUTURE) ×3 IMPLANT
SUT PROLENE 3 0 PS 2 (SUTURE) IMPLANT
SUT PROLENE 4 0 PS 2 18 (SUTURE) ×6 IMPLANT
SUT VIC AB 3-0 FS2 27 (SUTURE) ×3 IMPLANT
SYR CONTROL 10ML LL (SYRINGE) IMPLANT
SYSTEM CHEST DRAIN TLS 7FR (DRAIN) IMPLANT
TOWEL OR 17X24 6PK STRL BLUE (TOWEL DISPOSABLE) ×3 IMPLANT
TOWEL OR 17X26 10 PK STRL BLUE (TOWEL DISPOSABLE) ×3 IMPLANT
TUBE CONNECTING 12'X1/4 (SUCTIONS) ×1
TUBE CONNECTING 12X1/4 (SUCTIONS) ×2 IMPLANT
TUBE EVACUATION TLS (MISCELLANEOUS) ×3 IMPLANT
WATER STERILE IRR 1000ML POUR (IV SOLUTION) ×3 IMPLANT

## 2018-06-30 NOTE — Progress Notes (Signed)

## 2018-06-30 NOTE — Op Note (Signed)
Operative note   06/30/2018  Roseanne Kaufman MD  Preoperative diagnosis: Distal radius fracture comminuted complex greater than 3 part intra-articular-left distal radius fracture  Postop diagnosis: Same  Procedure: #1 left distal radius fracture open reduction internal fixation comminuted complex distal radius fracture with DVR [employer] cross lock plate and screw construct.  This was a greater than 3 part intra-articular fracture.  #2 AP lateral and oblique x-rays performed examined and interpreted by myself  Margarito Dehaas MD  Anesthesia: Block with IV sedation  Estimated blood loss minimal  Complications none immediate  Operative indications the patient presents for evaluation and surgical care.  Patient understands risk benefits and desires to proceed.  We have discussed with the patient all issues plans and concerns with this in mind we will proceed accordingly. We are planning surgery for your upper extremity. The risk and benefits of surgery to include risk of bleeding, infection, anesthesia,  damage to normal structures and failure of the surgery to accomplish its intended goals of relieving symptoms and restoring function have been discussed in detail. With this in mind we plan to proceed. I have specifically discussed with the patient the pre-and postoperative regime and the dos and don'ts and risk and benefits in great detail. Risk and benefits of surgery also include risk of dystrophy(CRPS), chronic nerve pain, failure of the healing process to go onto completion and other inherent risks of surgery The relavent the pathophysiology of the disease/injury process, as well as the alternatives for treatment and postoperative course of action has been discussed in great detail with the patient who desires to proceed.  We will do everything in our power to help you (the patient) restore function to the upper extremity. It is a pleasure to see this patient today.    Operative procedure: Patient was  seen by myself and anesthesia.  Appropriate anesthesia was induced and following this the patient was prepped with a Hibiclens pre-scrub followed by 10-minute surgical Betadine scrub and paint.  Once this was completed the extremity was elevated and the tourniquet was insufflated to 250 mmHg.  Timeout was observed preoperative antibiotics were given and the patient then underwent a very careful and cautious approach to the extremity with volar radial incision under 250 mm tourniquet control.  FCR tendon sheath was identified and dissected.  There were no complicating features.  Once this was completed the carpal canal contents were retracted ulnarly and the FCR was retracted radially.  We took very meticulous care of the radial artery and the carpal canal contents during the approach.  The pronator was accessed incised and lifted off of the fracture.  The fracture was then reassembled with standard orthopedic equipment and a DVR plate and screw construct from [employer] was accomplished in terms of placement and fixation of the fracture.  Adequate radial height, volar tilt and radial inclination was restored.  The distal radial ulnar joint, radiocarpal and midcarpal joints all were  stable and satisfactory.  We irrigated copiously and closed the pronator with 3-0 Vicryl followed by closure of the skin edge with Prolene.  Once again, the distal radius underwent open reduction internal fixation without complications.  The distal radial ulnar joint was stable.  The patient had no complications.  All radiographic parameters look quite well following the fixation.  Standard dressing of Adaptic Xeroform 4 x 4's gauze web roll Kerlix and a volar splint were applied.  The patient understands instructions of elevate move massage fingers notify us any problems occur and follow-up care  according to our standard protocol for a DVR plate and screw construct.  He has been a pleasure participate in the patient's care  and we look forward to spent in the patient's recovery.  Roseanne Kaufman MD

## 2018-06-30 NOTE — Transfer of Care (Signed)
Immediate Anesthesia Transfer of Care Note  Patient: Valerie Edwards  Procedure(s) Performed: OPEN REDUCTION INTERNAL FIXATION LEFT DISTAL RADIUS FRACUTRE WITH REPAIR RECONSTRUCTION AS NECESSARY (Left Arm Lower)  Patient Location: PACU  Anesthesia Type:MAC  Level of Consciousness: awake, alert , oriented and patient cooperative  Airway & Oxygen Therapy: Patient Spontanous Breathing and Patient connected to nasal cannula oxygen  Post-op Assessment: Report given to RN, Post -op Vital signs reviewed and stable and Patient moving all extremities X 4  Post vital signs: Reviewed and stable  Last Vitals:  Vitals Value Taken Time  BP 132/68 06/30/2018  9:07 AM  Temp    Pulse 62 06/30/2018  9:08 AM  Resp 21 06/30/2018  9:08 AM  SpO2 100 % 06/30/2018  9:08 AM  Vitals shown include unvalidated device data.  Last Pain:  Vitals:   06/30/18 0706  TempSrc: Oral         Complications: No apparent anesthesia complications

## 2018-06-30 NOTE — H&P (Signed)
Valerie Edwards is an 53 y.o. female.   Chief Complaint: Left distal radius fracture displaced comminuted complex HPI: Patient presents for surgical reconstruction left distal radius comminuted complex fracture intra-articular greater than 3 part.  Patient notes no other problems.  Patient is awake alert and oriented.  Patient denies neck back chest abdominal pain.  Past Medical History:  Diagnosis Date  . Anemia   . Fibroids   . Fracture    displaced left wrist fracture  . Hypothyroidism   . Menorrhagia   . Pneumonia     Past Surgical History:  Procedure Laterality Date  . UTERINE ARTERY EMBOLIZATION  02/26/2013  . WISDOM TOOTH EXTRACTION      Family History  Problem Relation Age of Onset  . Diabetes Sister   . Heart disease Maternal Grandmother   . Colon cancer Neg Hx   . Stomach cancer Neg Hx    Social History:  reports that she has never smoked. She has never used smokeless tobacco. She reports that she does not drink alcohol or use drugs.  Allergies: No Known Allergies  Facility-Administered Medications Prior to Admission  Medication Dose Route Frequency Provider Last Rate Last Dose  . 0.9 %  sodium chloride infusion  500 mL Intravenous Continuous Ladene Artist, MD       Medications Prior to Admission  Medication Sig Dispense Refill  . HYDROcodone-acetaminophen (NORCO/VICODIN) 5-325 MG tablet Take 1 tablet by mouth every 6 (six) hours as needed. 10 tablet 0  . levothyroxine (SYNTHROID, LEVOTHROID) 175 MCG tablet Take 175 mcg by mouth daily before breakfast.    . ferrous sulfate 325 (65 FE) MG tablet Take 325 mg by mouth daily with breakfast.      Results for orders placed or performed during the hospital encounter of 06/29/18 (from the past 48 hour(s))  SARS Coronavirus 2 (CEPHEID - Performed in [facility] hospital lab), Hosp Order     Status: None   Collection Time: 06/29/18 11:04 AM  Result Value Ref Range   SARS Coronavirus 2 NEGATIVE NEGATIVE   Comment: (NOTE) If result is NEGATIVE SARS-CoV-2 target nucleic acids are NOT DETECTED. The SARS-CoV-2 RNA is generally detectable in upper and lower  respiratory specimens during the acute phase of infection. The lowest  concentration of SARS-CoV-2 viral copies this assay can detect is 250  copies / mL. A negative result does not preclude SARS-CoV-2 infection  and should not be used as the sole basis for treatment or other  patient management decisions.  A negative result may occur with  improper specimen collection / handling, submission of specimen other  than nasopharyngeal swab, presence of viral mutation(s) within the  areas targeted by this assay, and inadequate number of viral copies  (<250 copies / mL). A negative result must be combined with clinical  observations, patient history, and epidemiological information. If result is POSITIVE SARS-CoV-2 target nucleic acids are DETECTED. The SARS-CoV-2 RNA is generally detectable in upper and lower  respiratory specimens dur ing the acute phase of infection.  Positive  results are indicative of active infection with SARS-CoV-2.  Clinical  correlation with patient history and other diagnostic information is  necessary to determine patient infection status.  Positive results do  not rule out bacterial infection or co-infection with other viruses. If result is PRESUMPTIVE POSTIVE SARS-CoV-2 nucleic acids MAY BE PRESENT.   A presumptive positive result was obtained on the submitted specimen  and confirmed on repeat testing.  While 2019 novel coronavirus  (  SARS-CoV-2) nucleic acids may be present in the submitted sample  additional confirmatory testing may be necessary for epidemiological  and / or clinical management purposes  to differentiate between  SARS-CoV-2 and other Sarbecovirus currently known to infect humans.  If clinically indicated additional testing with an alternate test  methodology [id number]) is advised. The SARS-CoV-2  RNA is generally  detectable in upper and lower respiratory sp ecimens during the acute  phase of infection. The expected result is Negative. Fact Sheet for Patients:  StrictlyIdeas.no Fact Sheet for Healthcare Providers: BankingDealers.co.za This test is not yet approved or cleared by the [place] FDA and has been authorized for detection and/or diagnosis of SARS-CoV-2 by FDA under an Emergency Use Authorization (EUA).  This EUA will remain in effect (meaning this test can be used) for the duration of the COVID-19 declaration under Section 564(b)(1) of the Act, 21 U.S.C. section 360bbb-3(b)(1), unless the authorization is terminated or revoked sooner. Performed at [facility], [place] [address], [place], [place] [address]    No results found.  Review of Systems  Respiratory: Negative.   Cardiovascular: Negative.   Gastrointestinal: Negative.   Genitourinary: Negative.     Blood pressure 136/78, pulse 61, temperature 98.4 F (36.9 C), temperature source Oral, resp. rate 20, height 5\' 4"  (1.626 m), weight 72.6 kg, last menstrual period 06/28/2018, SpO2 98 %. Physical Exam  Comminuted complex distal radius fracture neurovascularly intact.  Elbow and upper arm are nontender.  Remainder of her examination is stable.  There is no signs of compartment syndrome or skin lesion.  The patient is alert and oriented in no acute distress. The patient complains of pain in the affected upper extremity.  The patient is noted to have a normal HEENT exam. Lung fields show equal chest expansion and no shortness of breath. Abdomen exam is nontender without distention. Lower extremity examination does not show any fracture dislocation or blood clot symptoms. Pelvis is stable and the neck and back are stable and nontender. Assessment/Plan We will plan for open reduction internal fixation left distal radius fracture with repair  reconstruction is necessary.  I discussed with her all issues plans concerns pre-and postop measures and plans for follow-up.   We are planning surgery for your upper extremity. The risk and benefits of surgery to include risk of bleeding, infection, anesthesia,  damage to normal structures and failure of the surgery to accomplish its intended goals of relieving symptoms and restoring function have been discussed in detail. With this in mind we plan to proceed. I have specifically discussed with the patient the pre-and postoperative regime and the dos and don'ts and risk and benefits in great detail. Risk and benefits of surgery also include risk of dystrophy(CRPS), chronic nerve pain, failure of the healing process to go onto completion and other inherent risks of surgery The relavent the pathophysiology of the disease/injury process, as well as the alternatives for treatment and postoperative course of action has been discussed in great detail with the patient who desires to proceed.  We will do everything in our power to help you (the patient) restore function to the upper extremity. It is a pleasure to see this patient today.  Willa Frater III, MD 06/30/2018, 7:37 AM

## 2018-06-30 NOTE — Anesthesia Procedure Notes (Signed)
Anesthesia Regional Block: Interscalene brachial plexus block   Pre-Anesthetic Checklist: ,, timeout performed, Correct Patient, Correct Site, Correct Laterality, Correct Procedure, Correct Position, site marked, Risks and benefits discussed,  Surgical consent,  Pre-op evaluation,  At surgeon's request and post-op pain management  Laterality: Left  Prep: chloraprep       Needles:  Injection technique: Single-shot  Needle Type: Stimulator Needle - 40     Needle Length: 4cm  Needle Gauge: 22     Additional Needles:   Procedures:,,,, ultrasound used (permanent image in chart),,,,  Narrative:  Start time: 06/30/2018 7:45 AM End time: 06/30/2018 7:50 AM Injection made incrementally with aspirations every 5 mL.  Performed by: Personally  Anesthesiologist: Nolon Nations, MD  Additional Notes: BP cuff, EKG monitors applied. Sedation begun. Nerve location verified with U/S. Anesthetic injected incrementally, slowly , and after neg aspirations under direct u/s guidance. Good perineural spread. Tolerated well.

## 2018-06-30 NOTE — Discharge Instructions (Signed)
Please elevate move massage her fingers.  Please keep your bandage clean and dry.  We have phoned in medicine to your pharmacy.  Please pick this up.  He can take the pain medicine as needed and muscle spasm medicine as needed   Please keep your arm elevated at all times.   We recommend that you to take vitamin C 1000 mg a day to promote healing. We also recommend that if you require  pain medicine that you take a stool softener to prevent constipation as most pain medicines will have constipation side effects. We recommend either Peri-Colace or Senokot and recommend that you also consider adding MiraLAX as well to prevent the constipation affects from pain medicine if you are required to use them. These medicines are over the counter and may be purchased at a local pharmacy. A cup of yogurt and a probiotic can also be helpful during the recovery process as the medicines can disrupt your intestinal environment.   Keep bandage clean and dry.  Call for any problems.  No smoking.  Criteria for driving a car: you should be off your pain medicine for 7-8 hours, able to drive one handed(confident), thinking clearly and feeling able in your judgement to drive. Continue elevation as it will decrease swelling.  If instructed by MD move your fingers within the confines of the bandage/splint.  Use ice if instructed by your MD. Call immediately for any sudden loss of feeling in your hand/arm or change in functional abilities of the extremity.

## 2018-06-30 NOTE — Anesthesia Postprocedure Evaluation (Signed)
Anesthesia Post Note  Patient: Valerie Edwards  Procedure(s) Performed: OPEN REDUCTION INTERNAL FIXATION LEFT DISTAL RADIUS FRACUTRE WITH REPAIR RECONSTRUCTION AS NECESSARY (Left Arm Lower)     Patient location during evaluation: PACU Anesthesia Type: Regional Level of consciousness: awake and alert Pain management: pain level controlled Vital Signs Assessment: post-procedure vital signs reviewed and stable Respiratory status: spontaneous breathing Cardiovascular status: stable Anesthetic complications: no    Last Vitals:  Vitals:   06/30/18 0940 06/30/18 0955  BP: 116/66 117/73  Pulse: 61 60  Resp: 13 15  Temp:    SpO2: 100% 96%    Last Pain:  Vitals:   06/30/18 1020  TempSrc:   PainSc: 0-No pain                 Nolon Nations

## 2018-07-02 ENCOUNTER — Encounter (HOSPITAL_COMMUNITY): Payer: Self-pay | Admitting: Orthopedic Surgery
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
## 2020-09-25 IMAGING — DX LEFT FOREARM - 2 VIEW
2 series · 2 of 2 positions shown · non-contrast
Comparison: None.

CLINICAL DATA: Acute pain due to trauma

EXAM:
LEFT FOREARM - 2 VIEW

[x forearm ap left]
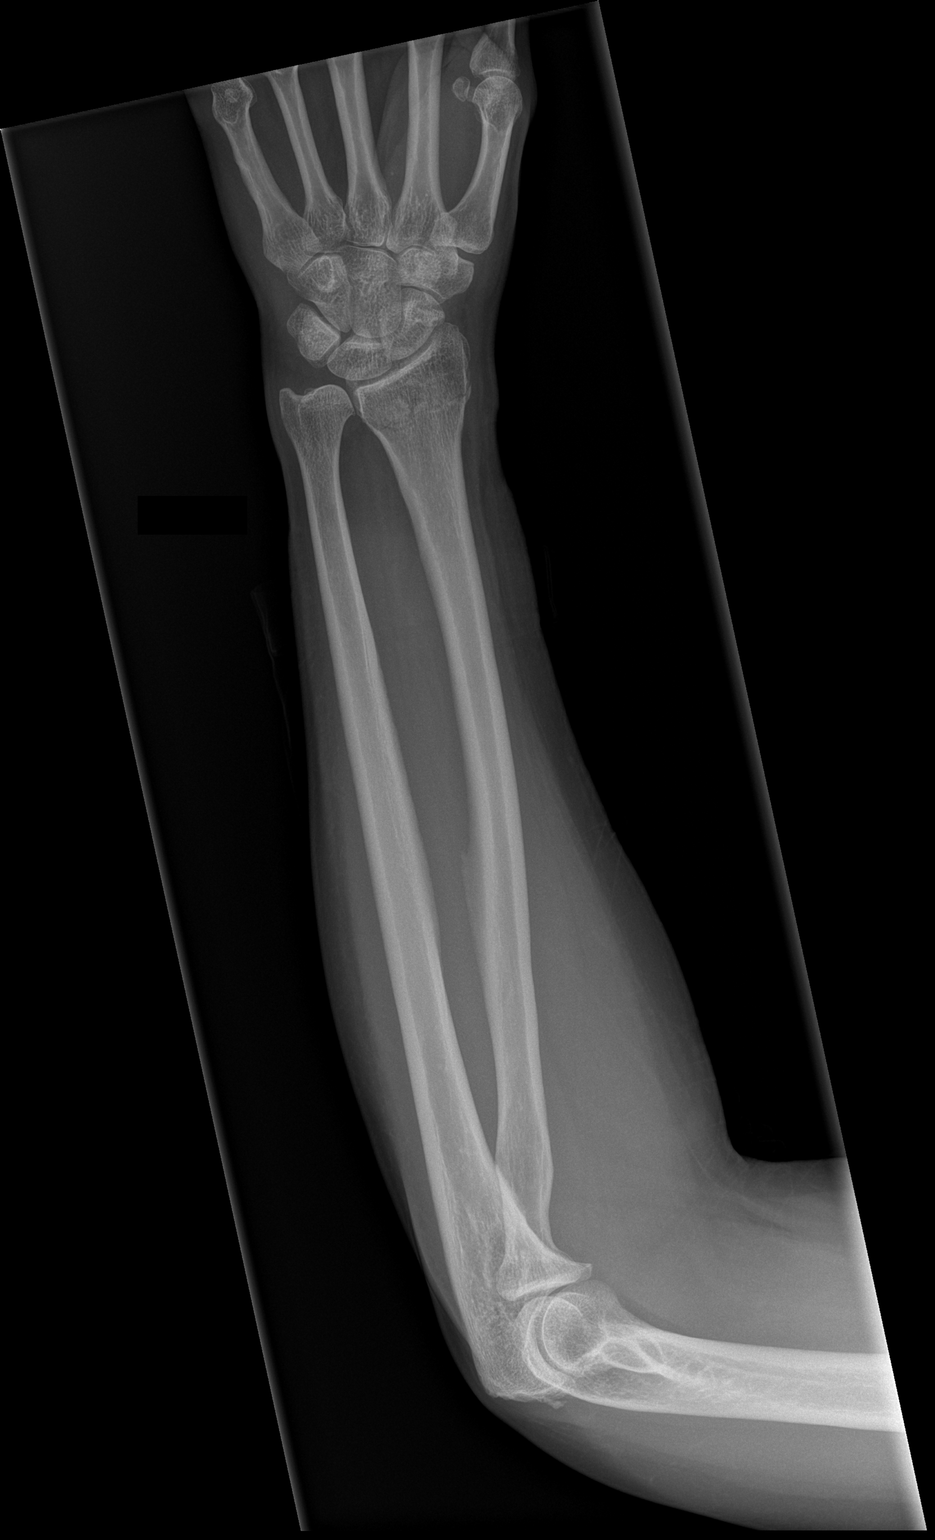

[x forearm lat left]
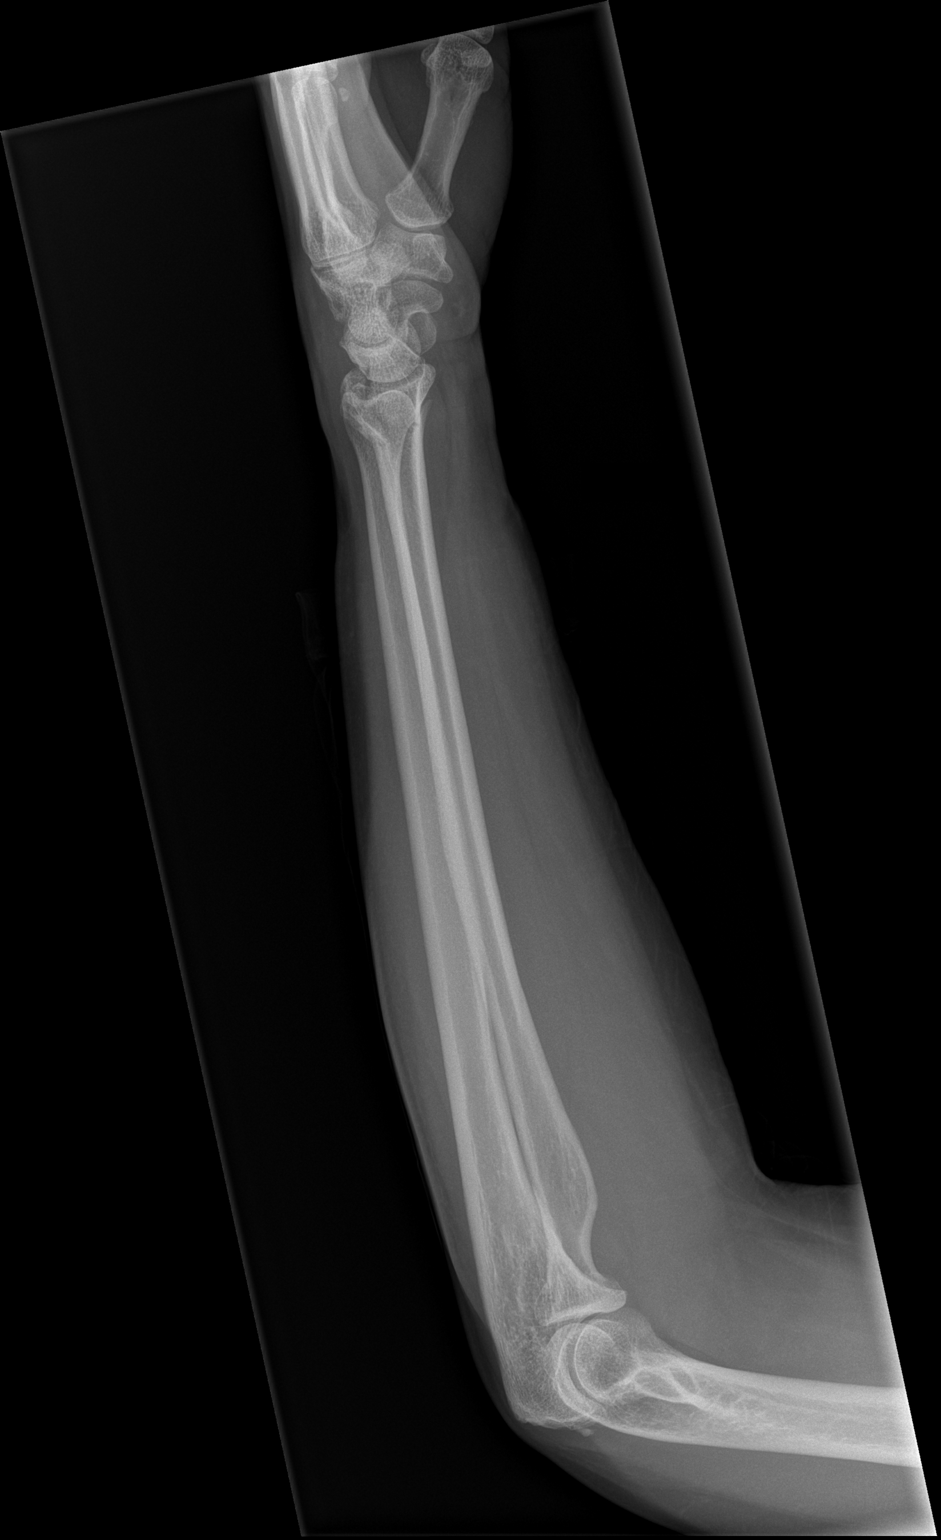

[2 of 2 positions shown; findings below may reference images not displayed]

FINDINGS: There is an acute comminuted intra-articular fracture involving the
distal radius. There is surrounding soft tissue swelling. There is
no radiopaque foreign body.
IMPRESSION: Acute comminuted intra-articular fracture of the distal radius.
Dedicated wrist radiographs are recommended for further evaluation.

## 2021-02-06 ENCOUNTER — Encounter (HOSPITAL_COMMUNITY): Payer: Self-pay | Admitting: *Deleted

## 2021-02-06 ENCOUNTER — Emergency Department (HOSPITAL_COMMUNITY)
Admission: EM | Admit: 2021-02-06 | Discharge: 2021-02-06 | Disposition: A | Discharge status: Home / Self Care | Payer: No Typology Code available for payment source | Attending: Emergency Medicine | Admitting: Emergency Medicine

## 2021-02-06 ENCOUNTER — Other Ambulatory Visit: Payer: Self-pay

## 2021-02-06 DIAGNOSIS — U071 COVID-19: Secondary | ICD-10-CM | POA: Insufficient documentation

## 2021-02-06 DIAGNOSIS — R519 Headache, unspecified: Secondary | ICD-10-CM | POA: Diagnosis present

## 2021-02-06 DIAGNOSIS — M542 Cervicalgia: Secondary | ICD-10-CM | POA: Diagnosis not present

## 2021-02-06 LAB — RESP PANEL BY RT-PCR (FLU A&B, COVID) ARPGX2
Influenza A by PCR: NEGATIVE
Influenza B by PCR: NEGATIVE
SARS Coronavirus 2 by RT PCR: POSITIVE — AB

## 2021-02-06 MED ORDER — ONDANSETRON HCL 4 MG PO TABS: 4.0000 mg | ORAL_TABLET | Freq: Four times a day (QID) | ORAL | 0 refills | Status: AC

## 2021-02-06 MED ORDER — IBUPROFEN 800 MG PO TABS
800.0000 mg | ORAL_TABLET | Freq: Once | ORAL | Status: AC
  Administered 2021-02-06: 800 mg via ORAL
  Filled 2021-02-06: qty 1

## 2021-02-06 MED ORDER — ACETAMINOPHEN 325 MG PO TABS
650.0000 mg | ORAL_TABLET | Freq: Once | ORAL | Status: AC
  Administered 2021-02-06: 650 mg via ORAL
  Filled 2021-02-06: qty 2

## 2021-02-06 NOTE — ED Provider Triage Note (Signed)
Emergency Medicine Provider Triage Evaluation Note  Valerie Edwards , a 56 y.o. female  was evaluated in triage.  Pt complains of flu like symptoms since yesterday. Patient complains of cough, fevers, nausea. Patient denies being vaccinated against [place] but states she has been vaccinated against the flu. 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  Review of Systems  Positive: Nausea, headache, body aches, cough, sore throat Negative: CP, SOB, abdominal pain, vomiting, diarrhea  Physical Exam  BP 109/72 (BP Location: Left Arm)    Pulse 94    Temp 98.5 F (36.9 C) (Oral)    Resp 16    Ht 5\' 4"  (1.626 m)    Wt 68 kg    SpO2 98%    BMI 25.75 kg/m  Gen:   Awake, no distress   Resp:  Normal effort. Lungs are clear MSK:   Moves extremities without difficulty  Other:    Medical Decision Making  Medically screening exam initiated at 2:32 PM.  Appropriate orders placed.  Jose Persia was informed that the remainder of the evaluation will be completed by another provider, this initial triage assessment does not replace that evaluation, and the importance of remaining in the ED until their evaluation is complete.     Azucena Cecil, PA-C 02/06/21 1434

## 2021-02-06 NOTE — ED Provider Notes (Signed)
[facility] DEPT Provider Note   CSN: [id number] Arrival date & time: 02/06/21  1416     History  Chief Complaint  Patient presents with   Generalized Body Aches   Cough   Headache    Valerie Edwards is a 56 y.o. female with no significant past medical history presents with new onset headache, body ache, productive cough for the last 2 days.  Patient also endorses some sore throat.  Patient denies chest pain, shortness of breath.  She denies nausea, vomiting, diarrhea but does report that she has had slightly decreased appetite.  Patient has not taken anything for her pain or symptoms at this time.  She reports her body aches are all over.  Patient denies any vision changes, numbness, tingling, weakness associated with her headaches.  Patient denies history of migraine, epilepsy, syncope.   Cough Associated symptoms: headaches and myalgias   Headache Associated symptoms: cough and myalgias       Home Medications Prior to Admission medications   Medication Sig Start Date End Date Taking? Authorizing Provider  ondansetron (ZOFRAN) 4 MG tablet Take 1 tablet (4 mg total) by mouth every 6 (six) hours. 02/06/21  Yes Koda Routon H, PA-C  ferrous sulfate 325 (65 FE) MG tablet Take 325 mg by mouth daily with breakfast.    [provider]  HYDROcodone-acetaminophen (NORCO/VICODIN) 5-325 MG tablet Take 1 tablet by mouth every 6 (six) hours as needed. 06/20/18   Ripley Fraise, MD  levothyroxine (SYNTHROID, LEVOTHROID) 175 MCG tablet Take 175 mcg by mouth daily before breakfast.    [provider]      Allergies    Patient has no known allergies.    Review of Systems   Review of Systems  Respiratory:  Positive for cough.   Musculoskeletal:  Positive for myalgias.  Neurological:  Positive for headaches.  All other systems reviewed and are negative.  Physical Exam Updated Vital Signs BP 99/65    Pulse 90    Temp 98.5 F (36.9 C)  (Oral)    Resp 17    Ht 5\' 4"  (1.626 m)    Wt 68 kg    LMP 06/28/2018 (Exact Date)    SpO2 98%    BMI 25.75 kg/m  Physical Exam Vitals and nursing note reviewed.  Constitutional:      General: She is not in acute distress.    Appearance: Normal appearance.  HENT:     Head: Normocephalic and atraumatic.  Eyes:     General:        Right eye: No discharge.        Left eye: No discharge.  Neck:     Comments: Some generalized tenderness palpation paraspinous muscles of the cervical spine, however no evidence of rigidity.  Negative Kernig, Brudzinski. Cardiovascular:     Rate and Rhythm: Normal rate and regular rhythm.     Heart sounds: No murmur heard.   No friction rub. No gallop.  Pulmonary:     Effort: Pulmonary effort is normal. No respiratory distress.     Breath sounds: Normal breath sounds. No stridor. No wheezing, rhonchi or rales.  Abdominal:     General: Bowel sounds are normal.     Palpations: Abdomen is soft.  Skin:    General: Skin is warm and dry.     Capillary Refill: Capillary refill takes less than 2 seconds.  Neurological:     Mental Status: She is alert and oriented to person, place,  and time.     GCS: GCS eye subscore is 4. GCS verbal subscore is 5. GCS motor subscore is 6.     Cranial Nerves: No cranial nerve deficit.     Comments: Cranial nerves III through XII grossly intact.  Intact finger-nose.  Romberg negative, gait normal.  Alert and oriented x3.  Intact strength 5 out of 5 bilateral upper and lower extremities.  Psychiatric:        Mood and Affect: Mood normal.        Behavior: Behavior normal.    ED Results / Procedures / Treatments   Labs (all labs ordered are listed, but only abnormal results are displayed) Labs Reviewed  RESP PANEL BY RT-PCR (FLU A&B, COVID) ARPGX2 - Abnormal; Notable for the following components:      Result Value   SARS Coronavirus 2 by RT PCR POSITIVE (*)    All other components within normal limits     EKG None  Radiology No results found.  Procedures Procedures    Medications Ordered in ED Medications  ibuprofen (ADVIL) tablet 800 mg (800 mg Oral Given 02/06/21 1734)  acetaminophen (TYLENOL) tablet 650 mg (650 mg Oral Given 02/06/21 1734)    ED Course/ Medical Decision Making/ A&P                           Medical Decision Making  Is an overall well-appearing patient with no significant past medical history presents with headache, body ache, productive cough for the last 2 days.  Her headache is frontal, with no focal neurologic deficits.  She has no accessory breath sounds, no chest pain.  My differential diagnosis includes upper respiratory infection including COVID, flu, headaches may be tension type versus other.  Differential diagnosis for headache includes tension, migraine, cluster.  This patient has no unilateral signs, focal neurologic deficits, nausea, vomiting, photophobia believe that this headache is related to overall dehydration status, and upper respiratory infection.  I personally ordered and reviewed respiratory virus panel which is positive for COVID.  Patient with stable vital signs, discharged with instructions to perform supportive care including plenty of fluids, ibuprofen, Tylenol for body aches and fever, and over-the-counter cough and cold medication as needed for symptoms.  Patient discharged in stable condition, return precautions given. Final Clinical Impression(s) / ED Diagnoses Final diagnoses:  [id number]    Rx / DC Orders ED Discharge Orders          Ordered    ondansetron (ZOFRAN) 4 MG tablet  Every 6 hours        02/06/21 1737              Jhordan Kinter, [place] H, PA-C 02/06/21 1747    Daleen Bo, MD 02/07/21 [phone]

## 2021-02-06 NOTE — ED Triage Notes (Signed)
Headache, body aches, productive cough for 2 days

## 2021-02-06 NOTE — Discharge Instructions (Signed)
As we discussed your respiratory virus panel was positive for COVID-19.  For most people this infection progresses like normal cold with lots of body aches, fever, headache, with occasional nausea, vomiting, loss of sense of smell or taste.  At the worst this infection can progress to affect you heart or lungs, please monitor for signs of worsening shortness of breath, chest pain.

## 2022-05-28 ENCOUNTER — Emergency Department (HOSPITAL_COMMUNITY): Payer: 59

## 2022-05-28 ENCOUNTER — Encounter (HOSPITAL_COMMUNITY): Payer: Self-pay

## 2022-05-28 ENCOUNTER — Emergency Department (HOSPITAL_COMMUNITY)
Admission: EM | Admit: 2022-05-28 | Discharge: 2022-05-28 | Disposition: A | Discharge status: Home / Self Care | Payer: 59 | Attending: Emergency Medicine | Admitting: Emergency Medicine

## 2022-05-28 DIAGNOSIS — Z79899 Other long term (current) drug therapy: Secondary | ICD-10-CM | POA: Diagnosis not present

## 2022-05-28 DIAGNOSIS — E039 Hypothyroidism, unspecified: Secondary | ICD-10-CM | POA: Diagnosis not present

## 2022-05-28 DIAGNOSIS — J0140 Acute pansinusitis, unspecified: Secondary | ICD-10-CM

## 2022-05-28 DIAGNOSIS — J069 Acute upper respiratory infection, unspecified: Secondary | ICD-10-CM | POA: Diagnosis not present

## 2022-05-28 DIAGNOSIS — Z20822 Contact with and (suspected) exposure to covid-19: Secondary | ICD-10-CM | POA: Diagnosis not present

## 2022-05-28 DIAGNOSIS — G44219 Episodic tension-type headache, not intractable: Secondary | ICD-10-CM | POA: Diagnosis not present

## 2022-05-28 DIAGNOSIS — R059 Cough, unspecified: Secondary | ICD-10-CM | POA: Diagnosis present

## 2022-05-28 LAB — RESP PANEL BY RT-PCR (RSV, FLU A&B, COVID)  RVPGX2
Influenza A by PCR: NEGATIVE
Influenza B by PCR: NEGATIVE
Resp Syncytial Virus by PCR: NEGATIVE
SARS Coronavirus 2 by RT PCR: NEGATIVE

## 2022-05-28 LAB — GROUP A STREP BY PCR: Group A Strep by PCR: NOT DETECTED

## 2022-05-28 MED ORDER — BENZONATATE 100 MG PO CAPS: 100.0000 mg | ORAL_CAPSULE | Freq: Three times a day (TID) | ORAL | 0 refills | Status: AC

## 2022-05-28 MED ORDER — CETIRIZINE-PSEUDOEPHEDRINE ER 5-120 MG PO TB12: 1.0000 | ORAL_TABLET | Freq: Every day | ORAL | 0 refills | Status: AC

## 2022-05-28 MED ORDER — ACETAMINOPHEN 500 MG PO TABS
1000.0000 mg | ORAL_TABLET | Freq: Once | ORAL | Status: AC
  Administered 2022-05-28: 1000 mg via ORAL
  Filled 2022-05-28: qty 2

## 2022-05-28 MED ORDER — AMOXICILLIN-POT CLAVULANATE 875-125 MG PO TABS: 1.0000 | ORAL_TABLET | Freq: Two times a day (BID) | ORAL | 0 refills | Status: AC

## 2022-05-28 NOTE — ED Triage Notes (Signed)
Pt c/o headaches x several weeks, along with cough that causes her to have emesis. Pt c/o nasal congestion. Pt has taken Thera flu, motrin, advil without relief. Pt not aware of any seasonal allergies.

## 2022-05-28 NOTE — Discharge Instructions (Addendum)
Please take your medications as prescribed. Take tylenol/ibuprofen for pain. I recommend close follow-up with PCP for reevaluation.  Please do not hesitate to return to emergency department if worrisome signs symptoms we discussed become apparent.  

## 2022-05-28 NOTE — ED Provider Notes (Signed)
[place] EMERGENCY DEPARTMENT AT [facility] Provider Note   CSN: [id number] Arrival date & time: 05/28/22  1232     History  Chief Complaint  Patient presents with   Cough   Headache    Valerie Edwards is a 57 y.o. female past medical history of hypothyroidism presents today for evaluation of cold-like symptoms.  Patient reports that she has had cough, runny nose, headache in the last 2 to 3 months.  States she had a history of migraine but the headache is different this time.  The pain is located in her forehead, pressure-like, nonradiating.  She denies photophobia or phonophobia.  Patient has tried multiple OTC medication for her cold with no relief.   Cough Associated symptoms: headaches   Headache Associated symptoms: cough       Past Medical History:  Diagnosis Date   Anemia    Fibroids    Fracture    displaced left wrist fracture   Hypothyroidism    Menorrhagia    Pneumonia    Past Surgical History:  Procedure Laterality Date   ORIF WRIST FRACTURE Left 06/30/2018   Procedure: OPEN REDUCTION INTERNAL FIXATION LEFT DISTAL RADIUS FRACUTRE WITH REPAIR RECONSTRUCTION AS NECESSARY;  Surgeon: Dominica Severin, MD;  Location: [facility] OR;  Service: Orthopedics;  Laterality: Left;  90 MINS   UTERINE ARTERY EMBOLIZATION  02/26/2013   WISDOM TOOTH EXTRACTION       Home Medications Prior to Admission medications   Medication Sig Start Date End Date Taking? Authorizing Provider  amoxicillin-clavulanate (AUGMENTIN) 875-125 MG tablet Take 1 tablet by mouth every 12 (twelve) hours. 05/28/22  Yes Jeanelle Malling, PA  benzonatate (TESSALON) 100 MG capsule Take 1 capsule (100 mg total) by mouth every 8 (eight) hours. 05/28/22  Yes Jeanelle Malling, PA  cetirizine-pseudoephedrine (ZYRTEC-D) 5-120 MG tablet Take 1 tablet by mouth daily. 05/28/22  Yes Jeanelle Malling, PA  ferrous sulfate 325 (65 FE) MG tablet Take 325 mg by mouth daily with breakfast.    [provider]   HYDROcodone-acetaminophen (NORCO/VICODIN) 5-325 MG tablet Take 1 tablet by mouth every 6 (six) hours as needed. 06/20/18   Zadie Rhine, MD  levothyroxine (SYNTHROID, LEVOTHROID) 175 MCG tablet Take 175 mcg by mouth daily before breakfast.    [provider]  ondansetron (ZOFRAN) 4 MG tablet Take 1 tablet (4 mg total) by mouth every 6 (six) hours. 02/06/21   Prosperi, Christian H, PA-C      Allergies    Patient has no known allergies.    Review of Systems   Review of Systems  Respiratory:  Positive for cough.   Neurological:  Positive for headaches.    Physical Exam Updated Vital Signs BP 113/79 (BP Location: Left Arm)   Pulse 89   Temp 98.7 F (37.1 C) (Oral)   Resp 16   Ht 5\' 4"  (1.626 m)   Wt 68 kg   LMP 06/28/2018 (Exact Date)   SpO2 96%   BMI 25.75 kg/m  Physical Exam Vitals and nursing note reviewed.  Constitutional:      Appearance: Normal appearance.  HENT:     Head: Normocephalic and atraumatic.     Mouth/Throat:     Mouth: Mucous membranes are moist.  Eyes:     General: No scleral icterus. Cardiovascular:     Rate and Rhythm: Normal rate and regular rhythm.     Pulses: Normal pulses.     Heart sounds: Normal heart sounds.  Pulmonary:  Effort: Pulmonary effort is normal.     Breath sounds: Normal breath sounds.  Abdominal:     General: Abdomen is flat.     Palpations: Abdomen is soft.     Tenderness: There is no abdominal tenderness.  Musculoskeletal:        General: No deformity.  Skin:    General: Skin is warm.     Findings: No rash.  Neurological:     General: No focal deficit present.     Mental Status: She is alert.  Psychiatric:        Mood and Affect: Mood normal.     ED Results / Procedures / Treatments   Labs (all labs ordered are listed, but only abnormal results are displayed) Labs Reviewed  RESP PANEL BY RT-PCR (RSV, FLU A&B, COVID)  [id number]  GROUP A STREP BY PCR    EKG None  Radiology CT Head Wo  Contrast  Result Date: 05/28/2022 CLINICAL DATA:  Headache. EXAM: CT HEAD WITHOUT CONTRAST TECHNIQUE: Contiguous axial images were obtained from the base of the skull through the vertex without intravenous contrast. RADIATION DOSE REDUCTION: This exam was performed according to the departmental dose-optimization program which includes automated exposure control, adjustment of the mA and/or kV according to patient size and/or use of iterative reconstruction technique. COMPARISON:  None Available. FINDINGS: Brain: There is no evidence for acute hemorrhage, hydrocephalus, mass lesion, or abnormal extra-axial fluid collection. No definite CT evidence for acute infarction. Vascular: No hyperdense vessel or unexpected calcification. Skull: No evidence for fracture. No worrisome lytic or sclerotic lesion. Sinuses/Orbits: Chronic mucosal disease noted sphenoid sinuses and ethmoid air cells. No mastoid effusion. Visualized portions of the globes and intraorbital fat are unremarkable. Other: None. IMPRESSION: 1. No acute intracranial abnormality. 2. Chronic paranasal sinus disease. Electronically Signed   By: Kennith Center M.D.   On: 05/28/2022 13:44    Procedures Procedures    Medications Ordered in ED Medications  acetaminophen (TYLENOL) tablet 1,000 mg (1,000 mg Oral Given 05/28/22 1317)    ED Course/ Medical Decision Making/ A&P                             Medical Decision Making Amount and/or Complexity of Data Reviewed Radiology: ordered.  Risk OTC drugs. Prescription drug management.   This patient presents to the ED for headache, cough, nasal congestion, this involves an extensive number of treatment options, and is a complaint that carries with a high risk of complications and morbidity.  The differential diagnosis includes sinusitis, COVID, flu, RSV, strep, pneumonia, bronchitis, migraines, tension type headache, infectious etiology.  This is not an exhaustive list.  Lab tests: I ordered  and personally interpreted labs.  The pertinent results include: Viral panel negative for COVID, flu, RSV.  Strep negative.  Imaging studies: I ordered imaging studies. I personally reviewed, interpreted imaging and agree with the radiologist's interpretations. The results include: CT scan of the head showed no acute intracranial abnormality. It showed chronic paranasal sinus disease.  Problem list/ ED course/ Critical interventions/ Medical management: HPI: See above Vital signs within normal range and stable throughout visit. Laboratory/imaging studies significant for: See above. On physical examination, patient is afebrile and appears in no acute distress.  Patient presents with cough, nasal congestion, headache in the last 2 to 3 months.  States she has tried multiple over-the-counter medications with no relief.  Viral panel negative for COVID, flu, RSV.  Strep negative.  CT scan of the head showed no intracranial abnormalities, did show chronic paranasal sinus disease.  Low suspicion for intracranial bleed based on CT scan results.  I think this is more likely to be tension-type headache versus sinusitis.  Given Tylenol for pain.  Reevaluation of patient after this medication showed that the patient improved.  I will send an Rx of Augmentin, Tessalon Perles and Zyrtec.  I advised patient to take her medication as prescribed, follow-up with PCP for further evaluation and management.  Strict return precaution given. I have reviewed the patient home medicines and have made adjustments as needed.  Cardiac monitoring/EKG: The patient was maintained on a cardiac monitor.  I personally reviewed and interpreted the cardiac monitor which showed an underlying rhythm of: sinus rhythm.  Additional history obtained: External records from outside source obtained and reviewed including: Chart review including previous notes, labs, imaging.  Consultations obtained:  Disposition Continued outpatient  therapy. Follow-up with PCP recommended for reevaluation of symptoms. Treatment plan discussed with patient.  Pt acknowledged understanding was agreeable to the plan. Worrisome signs and symptoms were discussed with patient, and patient acknowledged understanding to return to the ED if they noticed these signs and symptoms. Patient was stable upon discharge.   This chart was dictated using voice recognition software.  Despite best efforts to proofread,  errors can occur which can change the documentation meaning.          Final Clinical Impression(s) / ED Diagnoses Final diagnoses:  Subacute pansinusitis  Upper respiratory tract infection, unspecified type  Episodic tension-type headache, not intractable    Rx / DC Orders ED Discharge Orders          Ordered    amoxicillin-clavulanate (AUGMENTIN) 875-125 MG tablet  Every 12 hours        05/28/22 1415    benzonatate (TESSALON) 100 MG capsule  Every 8 hours        05/28/22 1415    cetirizine-pseudoephedrine (ZYRTEC-D) 5-120 MG tablet  Daily        05/28/22 1415              Jeanelle Malling, [place] 05/28/22 1451    Cathren Laine, MD 05/30/22 1410
